# Patient Record
Sex: Female | Born: 1990 | Race: Black or African American | Hispanic: No | Marital: Single | Smoking: Never smoker
Health system: Southern US, Community
[De-identification: ages and names within clinical notes are randomized; demographics above are authoritative.]

## PROBLEM LIST (undated history)

## (undated) DIAGNOSIS — N809 Endometriosis, unspecified: Secondary | ICD-10-CM

## (undated) HISTORY — PX: LAPAROSCOPY: SHX197

---

## 2015-10-15 ENCOUNTER — Encounter (HOSPITAL_COMMUNITY): Payer: Self-pay | Admitting: *Deleted

## 2015-10-15 DIAGNOSIS — Z3A01 Less than 8 weeks gestation of pregnancy: Secondary | ICD-10-CM | POA: Diagnosis not present

## 2015-10-15 DIAGNOSIS — O0281 Inappropriate change in quantitative human chorionic gonadotropin (hCG) in early pregnancy: Secondary | ICD-10-CM | POA: Insufficient documentation

## 2015-10-15 DIAGNOSIS — O2301 Infections of kidney in pregnancy, first trimester: Secondary | ICD-10-CM | POA: Diagnosis present

## 2015-10-15 DIAGNOSIS — N200 Calculus of kidney: Secondary | ICD-10-CM | POA: Insufficient documentation

## 2015-10-15 LAB — COMPREHENSIVE METABOLIC PANEL
ALT: 12 U/L — ABNORMAL LOW (ref 14–54)
AST: 17 U/L (ref 15–41)
Albumin: 3.8 g/dL (ref 3.5–5.0)
Alkaline Phosphatase: 66 U/L (ref 38–126)
Anion gap: 6 (ref 5–15)
BUN: 9 mg/dL (ref 6–20)
CO2: 21 mmol/L — ABNORMAL LOW (ref 22–32)
Calcium: 8.9 mg/dL (ref 8.9–10.3)
Chloride: 106 mmol/L (ref 101–111)
Creatinine, Ser: 0.81 mg/dL (ref 0.44–1.00)
GFR calc Af Amer: >60 mL/min (ref >60)
GFR calc non Af Amer: >60 mL/min (ref >60)
Glucose, Bld: 80 mg/dL (ref 65–99)
Potassium: 3.5 mmol/L (ref 3.5–5.1)
Sodium: 133 mmol/L — ABNORMAL LOW (ref 135–145)
Total Bilirubin: 0.8 mg/dL (ref 0.3–1.2)
Total Protein: 6.9 g/dL (ref 6.5–8.1)

## 2015-10-15 LAB — URINE MICROSCOPIC-ADD ON

## 2015-10-15 LAB — URINALYSIS, ROUTINE W REFLEX MICROSCOPIC
BILIRUBIN URINE: NEGATIVE
Glucose, UA: NEGATIVE mg/dL
KETONES UR: 15 mg/dL — AB
NITRITE: POSITIVE — AB
PH: 7 (ref 5.0–8.0)
PROTEIN: 30 mg/dL — AB
Specific Gravity, Urine: 1.009 (ref 1.005–1.030)

## 2015-10-15 LAB — CBC
HCT: 40.6 % (ref 36.0–46.0)
Hemoglobin: 13.5 g/dL (ref 12.0–15.0)
MCH: 32.1 pg (ref 26.0–34.0)
MCHC: 33.3 g/dL (ref 30.0–36.0)
MCV: 96.4 fL (ref 78.0–100.0)
Platelets: 250 10*3/uL (ref 150–400)
RBC: 4.21 MIL/uL (ref 3.87–5.11)
RDW: 12.6 % (ref 11.5–15.5)
WBC: 13.7 10*3/uL — ABNORMAL HIGH (ref 4.0–10.5)

## 2015-10-15 LAB — I-STAT BETA HCG BLOOD, ED (MC, WL, AP ONLY)

## 2015-10-15 NOTE — ED Triage Notes (Signed)
Pt c/o R lower back pain with dysuria onset this morning. Had an episode of diarrhea and NV. Reports not having a period x 2 months.

## 2015-10-16 ENCOUNTER — Emergency Department (HOSPITAL_COMMUNITY)
Admission: EM | Admit: 2015-10-16 | Discharge: 2015-10-16 | Disposition: A | Discharge status: Home / Self Care | Payer: BLUE CROSS/BLUE SHIELD | Attending: Emergency Medicine | Admitting: Emergency Medicine

## 2015-10-16 DIAGNOSIS — N12 Tubulo-interstitial nephritis, not specified as acute or chronic: Secondary | ICD-10-CM

## 2015-10-16 DIAGNOSIS — Z3490 Encounter for supervision of normal pregnancy, unspecified, unspecified trimester: Secondary | ICD-10-CM

## 2015-10-16 HISTORY — DX: Endometriosis, unspecified: N80.9

## 2015-10-16 MED ORDER — SULFAMETHOXAZOLE-TRIMETHOPRIM 800-160 MG PO TABS: 1.0000 | ORAL_TABLET | Freq: Once | ORAL | Status: DC

## 2015-10-16 MED ORDER — CEFTRIAXONE SODIUM 2 G IJ SOLR
2.0000 g | Freq: Once | INTRAMUSCULAR | Status: AC
  Administered 2015-10-16: 2 g via INTRAVENOUS
  Filled 2015-10-16: qty 2

## 2015-10-16 MED ORDER — SULFAMETHOXAZOLE-TRIMETHOPRIM 800-160 MG PO TABS: 1.0000 | ORAL_TABLET | Freq: Two times a day (BID) | ORAL | 0 refills | Status: AC

## 2015-10-16 NOTE — ED Provider Notes (Signed)
[facility]-EMERGENCY DEPT Provider Note   CSN: [id number] Arrival date & time: 10/15/15  1943     History   Chief Complaint Chief Complaint  Patient presents with  . Abdominal Pain    HPI Mindy Russo is a 25 y.o. female with no significant past medical history presenting today for back pain. Patient states this is been going on for the past 24 hours. She's also had dysuria and increased urgency. Patient states this is consistent with her prior urinary tract infections. Her pain is in her right flank. She denies any subjective fevers. She has had nausea and vomiting as well.  She has not taken anything for his symptoms. Patient is also concentric may be pregnant as she missed her menstrual cycle this month. There are no further complaints.  10 Systems reviewed and are negative for acute change except as noted in the HPI.    HPI  Past Medical History:  Diagnosis Date  . Endometriosis     There are no active problems to display for this patient.   History reviewed. No pertinent surgical history.  OB History    No data available       Home Medications    Prior to Admission medications   Medication Sig Start Date End Date Taking? Authorizing Provider  sulfamethoxazole-trimethoprim (BACTRIM DS,SEPTRA DS) 800-160 MG tablet Take 1 tablet by mouth 2 (two) times daily. 10/16/15 10/23/15  Tomasita Crumble, MD  sulfamethoxazole-trimethoprim (BACTRIM DS,SEPTRA DS) 800-160 MG tablet Take 1 tablet by mouth 2 (two) times daily. 10/16/15 10/19/15  Tomasita Crumble, MD    Family History No family history on file.  Social History Social History  Substance Use Topics  . Smoking status: Never Smoker  . Smokeless tobacco: Not on file  . Alcohol use No     Allergies   Amoxicillin   Review of Systems Review of Systems   Physical Exam Updated Vital Signs BP 129/68   Pulse 72   Temp 97.9 F (36.6 C)   Resp 18   Ht 5\' 8"  (1.727 m)   Wt 155 lb 5 oz (70.4 kg)   LMP 08/17/2015   SpO2  100%   BMI 23.62 kg/m   Physical Exam  Constitutional: She is oriented to person, place, and time. She appears well-developed and well-nourished. No distress.  HENT:  Head: Normocephalic and atraumatic.  Nose: Nose normal.  Mouth/Throat: Oropharynx is clear and moist. No oropharyngeal exudate.  Eyes: Conjunctivae and EOM are normal. Pupils are equal, round, and reactive to light. No scleral icterus.  Neck: Normal range of motion. Neck supple. No JVD present. No tracheal deviation present. No thyromegaly present.  Cardiovascular: Normal rate, regular rhythm and normal heart sounds.  Exam reveals no gallop and no friction rub.   No murmur heard. Pulmonary/Chest: Effort normal and breath sounds normal. No respiratory distress. She has no wheezes. She exhibits no tenderness.  Abdominal: Soft. Bowel sounds are normal. She exhibits no distension and no mass. There is no tenderness. There is no rebound and no guarding.  Musculoskeletal: Normal range of motion. She exhibits no edema or tenderness.  Lymphadenopathy:    She has no cervical adenopathy.  Neurological: She is alert and oriented to person, place, and time. No cranial nerve deficit. She exhibits normal muscle tone.  Skin: Skin is warm and dry. No rash noted. No erythema. No pallor.  Nursing note and vitals reviewed.    ED Treatments / Results  Labs (all labs ordered are listed, but only abnormal  results are displayed) Labs Reviewed  COMPREHENSIVE METABOLIC PANEL - Abnormal; Notable for the following:       Result Value   Sodium 133 (*)    CO2 21 (*)    ALT 12 (*)    All other components within normal limits  CBC - Abnormal; Notable for the following:    WBC 13.7 (*)    All other components within normal limits  URINALYSIS, ROUTINE W REFLEX MICROSCOPIC (NOT [facility]) - Abnormal; Notable for the following:    APPearance CLOUDY (*)    Hgb urine dipstick MODERATE (*)    Ketones, ur 15 (*)    Protein, ur 30 (*)    Nitrite  POSITIVE (*)    Leukocytes, UA MODERATE (*)    All other components within normal limits  URINE MICROSCOPIC-ADD ON - Abnormal; Notable for the following:    Squamous Epithelial / LPF 6-30 (*)    Bacteria, UA MANY (*)    All other components within normal limits  I-STAT BETA HCG BLOOD, ED ([facility], [facility], [facility] ONLY) - Abnormal; Notable for the following:    I-stat hCG, quantitative >2,000.0 (*)    All other components within normal limits  POC URINE PREG, ED    EKG  EKG Interpretation None       Radiology No results found.  Procedures Procedures (including critical care time)  Medications Ordered in ED Medications  cefTRIAXone (ROCEPHIN) 2 g in dextrose 5 % 50 mL IVPB (not administered)     Initial Impression / Assessment and Plan / ED Course  I have reviewed the triage vital signs and the nursing notes.  Pertinent labs & imaging results that were available during my care of the patient were reviewed by me and considered in my medical decision making (see chart for details).  Clinical Course   Patient presents to the ED for back pain and urinary symptoms.  UA reveals an infection.  I have concern for pyeloneprhitis in the setting of pregnancy.  Patient is likely safe for DC home with keflex however she has an amoxicillin allergy.  [facility] to give the patient ceftriaxone in the ED the DC with bactrim.  Uptodate reveals that bactrim is category D in pregnancy, however Dr. Jillyn LedgerBeur states it is safe and he uses it all the time for treatment.  Will send home with 3 days and OB follow up.  Patient is amendable to the plan.  She appears well andin NAD. Vs remain within her normal limit and she is safe for DC.  Final Clinical Impressions(s) / ED Diagnoses   Final diagnoses:  Pyelonephritis  Pregnancy    New Prescriptions New Prescriptions   SULFAMETHOXAZOLE-TRIMETHOPRIM (BACTRIM DS,SEPTRA DS) 800-160 MG TABLET    Take 1 tablet by mouth 2 (two)  times daily.   SULFAMETHOXAZOLE-TRIMETHOPRIM (BACTRIM DS,SEPTRA DS) 800-160 MG TABLET    Take 1 tablet by mouth 2 (two) times daily.     Tomasita CrumbleAdeleke Casimira Sutphin, MD 10/16/15 [phone]

## 2015-12-22 ENCOUNTER — Ambulatory Visit (INDEPENDENT_AMBULATORY_CARE_PROVIDER_SITE_OTHER): Payer: BLUE CROSS/BLUE SHIELD | Admitting: Family Medicine

## 2015-12-22 ENCOUNTER — Encounter: Payer: Self-pay | Admitting: Family Medicine

## 2015-12-22 VITALS — BP 126/62 | HR 79 | Wt 162.5 lb

## 2015-12-22 DIAGNOSIS — O9989 Other specified diseases and conditions complicating pregnancy, childbirth and the puerperium: Secondary | ICD-10-CM

## 2015-12-22 DIAGNOSIS — Z3490 Encounter for supervision of normal pregnancy, unspecified, unspecified trimester: Secondary | ICD-10-CM | POA: Insufficient documentation

## 2015-12-22 DIAGNOSIS — O09899 Supervision of other high risk pregnancies, unspecified trimester: Secondary | ICD-10-CM

## 2015-12-22 DIAGNOSIS — O99820 Streptococcus B carrier state complicating pregnancy: Secondary | ICD-10-CM | POA: Insufficient documentation

## 2015-12-22 DIAGNOSIS — Z113 Encounter for screening for infections with a predominantly sexual mode of transmission: Secondary | ICD-10-CM | POA: Diagnosis not present

## 2015-12-22 DIAGNOSIS — Z283 Underimmunization status: Secondary | ICD-10-CM

## 2015-12-22 DIAGNOSIS — Z3482 Encounter for supervision of other normal pregnancy, second trimester: Secondary | ICD-10-CM

## 2015-12-22 MED ORDER — PRENATAL VITAMINS 0.8 MG PO TABS: 1.0000 | ORAL_TABLET | Freq: Every day | ORAL | 12 refills | Status: DC

## 2015-12-22 NOTE — Progress Notes (Signed)
Declined baby scripts

## 2015-12-22 NOTE — Progress Notes (Signed)
   Subjective:    Mindy Russo is a G2P1001 4535w0d being seen today for her first obstetrical visit.  Her obstetrical history is significant for short interval between pregnancies.Pregnancy history fully reviewed.  Patient reports no complaints.  Vitals:   12/22/15 1303  BP: 126/62  Pulse: 79  Weight: 162 lb 8 oz (73.7 kg)    HISTORY: OB History  Gravida Para Term Preterm AB Living  2 1 1     1   SAB TAB Ectopic Multiple Live Births          1    # Outcome Date GA Lbr Len/2nd Weight Sex Delivery Anes PTL Lv  2 Current           1 Term 12/17/14 2250w0d  6 lb 15 oz (3.147 kg) F Vag-Spont  N LIV     Past Medical History:  Diagnosis Date  . Endometriosis    Past Surgical History:  Procedure Laterality Date  . LAPAROSCOPY     Family History  Problem Relation Age of Onset  . Diabetes Mother      Exam    Uterus:  Fundal Height: 18 cm  Pelvic Exam:    Skin: normal coloration and turgor, no rashes    Neurologic: normal   Extremities: normal strength, tone, and muscle mass   HEENT extra ocular movement intact and sclera clear, anicteric   Mouth/Teeth mucous membranes moist, pharynx normal without lesions   Neck supple   Cardiovascular: regular rate and rhythm, no murmurs or gallops   Respiratory:  appears well, vitals normal, no respiratory distress, acyanotic, normal RR, ear and throat exam is normal, neck free of mass or lymphadenopathy, chest clear, no wheezing, crepitations, rhonchi, normal symmetric air entry   Abdomen: soft, non-tender; bowel sounds normal; no masses,  no organomegaly      Assessment/Plan:  1. Encounter for supervision of other normal pregnancy in second trimester - Prenatal Profile - Hemoglobinopathy Evaluation - GC/Chlamydia probe amp ([place])not at [facility] - Pain Mgmt, Profile 6 Conf w/o mM, U - US MFM OB COMP + 14 WK; Future - Culture, OB Urine - Prenatal Multivit-Min-Fe-FA (PRENATAL VITAMINS) 0.8 MG tablet; Take 1 tablet by mouth  daily.  Dispense: 30 tablet; Refill: 12 Genetic Screening discussed Quad Screen: declined. Per patient had normal pap last year  Follow up in 4 weeks.  Reva Boresanya S Pratt 12/22/2015

## 2015-12-22 NOTE — Patient Instructions (Signed)
Second Trimester of Pregnancy The second trimester is from week 13 through week 28 (months 4 through 6). The second trimester is often a time when you feel your best. Your body has also adjusted to being pregnant, and you begin to feel better physically. Usually, morning sickness has lessened or quit completely, you may have more energy, and you may have an increase in appetite. The second trimester is also a time when the fetus is growing rapidly. At the end of the sixth month, the fetus is about 9 inches long and weighs about 1 pounds. You will likely begin to feel the baby move (quickening) between 18 and 20 weeks of the pregnancy. Body changes during your second trimester Your body continues to go through many changes during your second trimester. The changes vary from woman to woman.  Your weight will continue to increase. You will notice your lower abdomen bulging out.  You may begin to get stretch marks on your hips, abdomen, and breasts.  You may develop headaches that can be relieved by medicines. The medicines should be approved by your health care provider.  You may urinate more often because the fetus is pressing on your bladder.  You may develop or continue to have heartburn as a result of your pregnancy.  You may develop constipation because certain hormones are causing the muscles that push waste through your intestines to slow down.  You may develop hemorrhoids or swollen, bulging veins (varicose veins).  You may have back pain. This is caused by:  Weight gain.  Pregnancy hormones that are relaxing the joints in your pelvis.  A shift in weight and the muscles that support your balance.  Your breasts will continue to grow and they will continue to become tender.  Your gums may bleed and may be sensitive to brushing and flossing.  Dark spots or blotches (chloasma, mask of pregnancy) may develop on your face. This will likely fade after the baby is born.  A dark line  from your belly button to the pubic area (linea nigra) may appear. This will likely fade after the baby is born.  You may have changes in your hair. These can include thickening of your hair, rapid growth, and changes in texture. Some women also have hair loss during or after pregnancy, or hair that feels dry or thin. Your hair will most likely return to normal after your baby is born. What to expect at prenatal visits During a routine prenatal visit:  You will be weighed to make sure you and the fetus are growing normally.  Your blood pressure will be taken.  Your abdomen will be measured to track your baby's growth.  The fetal heartbeat will be listened to.  Any test results from the previous visit will be discussed. Your health care provider may ask you:  How you are feeling.  If you are feeling the baby move.  If you have had any abnormal symptoms, such as leaking fluid, bleeding, severe headaches, or abdominal cramping.  If you are using any tobacco products, including cigarettes, chewing tobacco, and electronic cigarettes.  If you have any questions. Other tests that may be performed during your second trimester include:  Blood tests that check for:  Low iron levels (anemia).  Gestational diabetes (between 24 and 28 weeks).  Rh antibodies. This is to check for a protein on red blood cells (Rh factor).  Urine tests to check for infections, diabetes, or protein in the urine.  An ultrasound to   confirm the proper growth and development of the baby.  An amniocentesis to check for possible genetic problems.  Fetal screens for spina bifida and Down syndrome.  HIV (human immunodeficiency virus) testing. Routine prenatal testing includes screening for HIV, unless you choose not to have this test. Follow these instructions at home: Eating and drinking  Continue to eat regular, healthy meals.  Avoid raw meat, uncooked cheese, cat litter boxes, and soil used by cats. These  carry germs that can cause birth defects in the baby.  Take your prenatal vitamins.  Take 1500-2000 mg of calcium daily starting at the 20th week of pregnancy until you deliver your baby.  If you develop constipation:  Take over-the-counter or prescription medicines.  Drink enough fluid to keep your urine clear or pale yellow.  Eat foods that are high in fiber, such as fresh fruits and vegetables, whole grains, and beans.  Limit foods that are high in fat and processed sugars, such as fried and sweet foods. Activity  Exercise only as directed by your health care provider. Experiencing uterine cramps is a good sign to stop exercising.  Avoid heavy lifting, wear low heel shoes, and practice good posture.  Wear your seat belt at all times when driving.  Rest with your legs elevated if you have leg cramps or low back pain.  Wear a good support bra for breast tenderness.  Do not use hot tubs, steam rooms, or saunas. Lifestyle  Avoid all smoking, herbs, alcohol, and unprescribed drugs. These chemicals affect the formation and growth of the baby.  Do not use any products that contain nicotine or tobacco, such as cigarettes and e-cigarettes. If you need help quitting, ask your health care provider.  A sexual relationship may be continued unless your health care provider directs you otherwise. General instructions  Follow your health care provider's instructions regarding medicine use. There are medicines that are either safe or unsafe to take during pregnancy.  Take warm sitz baths to soothe any pain or discomfort caused by hemorrhoids. Use hemorrhoid cream if your health care provider approves.  If you develop varicose veins, wear support hose. Elevate your feet for 15 minutes, 3-4 times a day. Limit salt in your diet.  Visit your dentist if you have not gone yet during your pregnancy. Use a soft toothbrush to brush your teeth and be gentle when you floss.  Keep all follow-up  prenatal visits as told by your health care provider. This is important. Contact a health care provider if:  You have dizziness.  You have mild pelvic cramps, pelvic pressure, or nagging pain in the abdominal area.  You have persistent nausea, vomiting, or diarrhea.  You have a bad smelling vaginal discharge.  You have pain with urination. Get help right away if:  You have a fever.  You are leaking fluid from your vagina.  You have spotting or bleeding from your vagina.  You have severe abdominal cramping or pain.  You have rapid weight gain or weight loss.  You have shortness of breath with chest pain.  You notice sudden or extreme swelling of your face, hands, ankles, feet, or legs.  You have not felt your baby move in over an hour.  You have severe headaches that do not go away with medicine.  You have vision changes. Summary  The second trimester is from week 13 through week 28 (months 4 through 6). It is also a time when the fetus is growing rapidly.  Your body goes   through many changes during pregnancy. The changes vary from woman to woman.  Avoid all smoking, herbs, alcohol, and unprescribed drugs. These chemicals affect the formation and growth your baby.  Do not use any tobacco products, such as cigarettes, chewing tobacco, and e-cigarettes. If you need help quitting, ask your health care provider.  Contact your health care provider if you have any questions. Keep all prenatal visits as told by your health care provider. This is important. This information is not intended to replace advice given to you by your health care provider. Make sure you discuss any questions you have with your health care provider. Document Released: 01/05/2001 Document Revised: 06/19/2015 Document Reviewed: 03/14/2012 Elsevier Interactive Patient Education  2017 [employer]   Breastfeeding Deciding to breastfeed is one of the best choices you can make for you and your baby. A  change in hormones during pregnancy causes your breast tissue to grow and increases the number and size of your milk ducts. These hormones also allow proteins, sugars, and fats from your blood supply to make breast milk in your milk-producing glands. Hormones prevent breast milk from being released before your baby is born as well as prompt milk flow after birth. Once breastfeeding has begun, thoughts of your baby, as well as his or her sucking or crying, can stimulate the release of milk from your milk-producing glands. Benefits of breastfeeding For Your Baby  Your first milk (colostrum) helps your baby's digestive system function better.  There are antibodies in your milk that help your baby fight off infections.  Your baby has a lower incidence of asthma, allergies, and sudden infant death syndrome.  The nutrients in breast milk are better for your baby than infant formulas and are designed uniquely for your baby's needs.  Breast milk improves your baby's brain development.  Your baby is less likely to develop other conditions, such as childhood obesity, asthma, or type 2 diabetes mellitus. For You  Breastfeeding helps to create a very special bond between you and your baby.  Breastfeeding is convenient. Breast milk is always available at the correct temperature and costs nothing.  Breastfeeding helps to burn calories and helps you lose the weight gained during pregnancy.  Breastfeeding makes your uterus contract to its prepregnancy size faster and slows bleeding (lochia) after you give birth.  Breastfeeding helps to lower your risk of developing type 2 diabetes mellitus, osteoporosis, and breast or ovarian cancer later in life. Signs that your baby is hungry Early Signs of Hunger  Increased alertness or activity.  Stretching.  Movement of the head from side to side.  Movement of the head and opening of the mouth when the corner of the mouth or cheek is stroked  (rooting).  Increased sucking sounds, smacking lips, cooing, sighing, or squeaking.  Hand-to-mouth movements.  Increased sucking of fingers or hands. Late Signs of Hunger  Fussing.  Intermittent crying. Extreme Signs of Hunger  Signs of extreme hunger will require calming and consoling before your baby will be able to breastfeed successfully. Do not wait for the following signs of extreme hunger to occur before you initiate breastfeeding:  Restlessness.  A loud, strong cry.  Screaming. Breastfeeding basics  Breastfeeding Initiation  Find a comfortable place to sit or lie down, with your neck and back well supported.  Place a pillow or rolled up blanket under your baby to bring him or her to the level of your breast (if you are seated). Nursing pillows are specially designed to help   support your arms and your baby while you breastfeed.  Make sure that your baby's abdomen is facing your abdomen.  Gently massage your breast. With your fingertips, massage from your chest wall toward your nipple in a circular motion. This encourages milk flow. You may need to continue this action during the feeding if your milk flows slowly.  Support your breast with 4 fingers underneath and your thumb above your nipple. Make sure your fingers are well away from your nipple and your baby's mouth.  Stroke your baby's lips gently with your finger or nipple.  When your baby's mouth is open wide enough, quickly bring your baby to your breast, placing your entire nipple and as much of the colored area around your nipple (areola) as possible into your baby's mouth.  More areola should be visible above your baby's upper lip than below the lower lip.  Your baby's tongue should be between his or her lower gum and your breast.  Ensure that your baby's mouth is correctly positioned around your nipple (latched). Your baby's lips should create a seal on your breast and be turned out (everted).  It is common  for your baby to suck about 2-3 minutes in order to start the flow of breast milk. Latching  Teaching your baby how to latch on to your breast properly is very important. An improper latch can cause nipple pain and decreased milk supply for you and poor weight gain in your baby. Also, if your baby is not latched onto your nipple properly, he or she may swallow some air during feeding. This can make your baby fussy. Burping your baby when you switch breasts during the feeding can help to get rid of the air. However, teaching your baby to latch on properly is still the best way to prevent fussiness from swallowing air while breastfeeding. Signs that your baby has successfully latched on to your nipple:  Silent tugging or silent sucking, without causing you pain.  Swallowing heard between every 3-4 sucks.  Muscle movement above and in front of his or her ears while sucking. Signs that your baby has not successfully latched on to nipple:  Sucking sounds or smacking sounds from your baby while breastfeeding.  Nipple pain. If you think your baby has not latched on correctly, slip your finger into the corner of your baby's mouth to break the suction and place it between your baby's gums. Attempt breastfeeding initiation again. Signs of Successful Breastfeeding  Signs from your baby:  A gradual decrease in the number of sucks or complete cessation of sucking.  Falling asleep.  Relaxation of his or her body.  Retention of a small amount of milk in his or her mouth.  Letting go of your breast by himself or herself. Signs from you:  Breasts that have increased in firmness, weight, and size 1-3 hours after feeding.  Breasts that are softer immediately after breastfeeding.  Increased milk volume, as well as a change in milk consistency and color by the fifth day of breastfeeding.  Nipples that are not sore, cracked, or bleeding. Signs That Your Baby is Getting Enough Milk  Wetting at least  1-2 diapers during the first 24 hours after birth.  Wetting at least 5-6 diapers every 24 hours for the first week after birth. The urine should be clear or pale yellow by 5 days after birth.  Wetting 6-8 diapers every 24 hours as your baby continues to grow and develop.  At least 3 stools in   a 24-hour period by age 5 days. The stool should be soft and yellow.  At least 3 stools in a 24-hour period by age 7 days. The stool should be seedy and yellow.  No loss of weight greater than 10% of birth weight during the first 3 days of age.  Average weight gain of 4-7 ounces (113-198 g) per week after age 4 days.  Consistent daily weight gain by age 5 days, without weight loss after the age of 2 weeks. After a feeding, your baby may spit up a small amount. This is common. Breastfeeding frequency and duration Frequent feeding will help you make more milk and can prevent sore nipples and breast engorgement. Breastfeed when you feel the need to reduce the fullness of your breasts or when your baby shows signs of hunger. This is called "breastfeeding on demand." Avoid introducing a pacifier to your baby while you are working to establish breastfeeding (the first 4-6 weeks after your baby is born). After this time you may choose to use a pacifier. Research has shown that pacifier use during the first year of a baby's life decreases the risk of sudden infant death syndrome (SIDS). Allow your baby to feed on each breast as long as he or she wants. Breastfeed until your baby is finished feeding. When your baby unlatches or falls asleep while feeding from the first breast, offer the second breast. Because newborns are often sleepy in the first few weeks of life, you may need to awaken your baby to get him or her to feed. Breastfeeding times will vary from baby to baby. However, the following rules can serve as a guide to help you ensure that your baby is properly fed:  Newborns (babies 4 weeks of age or younger)  may breastfeed every 1-3 hours.  Newborns should not go longer than 3 hours during the day or 5 hours during the night without breastfeeding.  You should breastfeed your baby a minimum of 8 times in a 24-hour period until you begin to introduce solid foods to your baby at around 6 months of age. Breast milk pumping Pumping and storing breast milk allows you to ensure that your baby is exclusively fed your breast milk, even at times when you are unable to breastfeed. This is especially important if you are going back to work while you are still breastfeeding or when you are not able to be present during feedings. Your lactation consultant can give you guidelines on how long it is safe to store breast milk. A breast pump is a machine that allows you to pump milk from your breast into a sterile bottle. The pumped breast milk can then be stored in a refrigerator or freezer. Some breast pumps are operated by hand, while others use electricity. Ask your lactation consultant which type will work best for you. Breast pumps can be purchased, but some hospitals and breastfeeding support groups lease breast pumps on a monthly basis. A lactation consultant can teach you how to hand express breast milk, if you prefer not to use a pump. Caring for your breasts while you breastfeed Nipples can become dry, cracked, and sore while breastfeeding. The following recommendations can help keep your breasts moisturized and healthy:  Avoid using soap on your nipples.  Wear a supportive bra. Although not required, special nursing bras and tank tops are designed to allow access to your breasts for breastfeeding without taking off your entire bra or top. Avoid wearing underwire-style bras or extremely tight   bras.  Air dry your nipples for 3-4minutes after each feeding.  Use only cotton bra pads to absorb leaked breast milk. Leaking of breast milk between feedings is normal.  Use lanolin on your nipples after breastfeeding.  Lanolin helps to maintain your skin's normal moisture barrier. If you use pure lanolin, you do not need to wash it off before feeding your baby again. Pure lanolin is not toxic to your baby. You may also hand express a few drops of breast milk and gently massage that milk into your nipples and allow the milk to air dry. In the first few weeks after giving birth, some women experience extremely full breasts (engorgement). Engorgement can make your breasts feel heavy, warm, and tender to the touch. Engorgement peaks within 3-5 days after you give birth. The following recommendations can help ease engorgement:  Completely empty your breasts while breastfeeding or pumping. You may want to start by applying warm, moist heat (in the shower or with warm water-soaked hand towels) just before feeding or pumping. This increases circulation and helps the milk flow. If your baby does not completely empty your breasts while breastfeeding, pump any extra milk after he or she is finished.  Wear a snug bra (nursing or regular) or tank top for 1-2 days to signal your body to slightly decrease milk production.  Apply ice packs to your breasts, unless this is too uncomfortable for you.  Make sure that your baby is latched on and positioned properly while breastfeeding. If engorgement persists after 48 hours of following these recommendations, contact your health care provider or a lactation consultant. Overall health care recommendations while breastfeeding  Eat healthy foods. Alternate between meals and snacks, eating 3 of each per day. Because what you eat affects your breast milk, some of the foods may make your baby more irritable than usual. Avoid eating these foods if you are sure that they are negatively affecting your baby.  Drink milk, fruit juice, and water to satisfy your thirst (about 10 glasses a day).  Rest often, relax, and continue to take your prenatal vitamins to prevent fatigue, stress, and  anemia.  Continue breast self-awareness checks.  Avoid chewing and smoking tobacco. Chemicals from cigarettes that pass into breast milk and exposure to secondhand smoke may harm your baby.  Avoid alcohol and drug use, including marijuana. Some medicines that may be harmful to your baby can pass through breast milk. It is important to ask your health care provider before taking any medicine, including all over-the-counter and prescription medicine as well as vitamin and herbal supplements. It is possible to become pregnant while breastfeeding. If birth control is desired, ask your health care provider about options that will be safe for your baby. Contact a health care provider if:  You feel like you want to stop breastfeeding or have become frustrated with breastfeeding.  You have painful breasts or nipples.  Your nipples are cracked or bleeding.  Your breasts are red, tender, or warm.  You have a swollen area on either breast.  You have a fever or chills.  You have nausea or vomiting.  You have drainage other than breast milk from your nipples.  Your breasts do not become full before feedings by the fifth day after you give birth.  You feel sad and depressed.  Your baby is too sleepy to eat well.  Your baby is having trouble sleeping.  Your baby is wetting less than 3 diapers in a 24-hour period.  Your baby   has less than 3 stools in a 24-hour period.  Your baby's skin or the white part of his or her eyes becomes yellow.  Your baby is not gaining weight by 5 days of age. Get help right away if:  Your baby is overly tired (lethargic) and does not want to wake up and feed.  Your baby develops an unexplained fever. This information is not intended to replace advice given to you by your health care provider. Make sure you discuss any questions you have with your health care provider. Document Released: 01/11/2005 Document Revised: 06/25/2015 Document Reviewed:  07/05/2012 Elsevier Interactive Patient Education  2017 [employer]  

## 2015-12-22 NOTE — Progress Notes (Signed)
DECLINE FLU AT THIS TIME

## 2015-12-23 LAB — PRENATAL PROFILE (SOLSTAS)
ANTIBODY SCREEN: NEGATIVE
BASOS ABS: 0 {cells}/uL (ref 0–200)
Basophils Relative: 0 %
EOS PCT: 1 %
Eosinophils Absolute: 99 cells/uL (ref 15–500)
HEMATOCRIT: 35.6 % (ref 35.0–45.0)
HEMOGLOBIN: 11.8 g/dL (ref 11.7–15.5)
HEP B S AG: NEGATIVE
HIV 1&2 Ab, 4th Generation: NONREACTIVE
LYMPHS ABS: 1089 {cells}/uL (ref 850–3900)
Lymphocytes Relative: 11 %
MCH: 32.5 pg (ref 27.0–33.0)
MCHC: 33.1 g/dL (ref 32.0–36.0)
MCV: 98.1 fL (ref 80.0–100.0)
MONOS PCT: 4 %
MPV: 11.1 fL (ref 7.5–12.5)
Monocytes Absolute: 396 cells/uL (ref 200–950)
NEUTROS ABS: 8316 {cells}/uL — AB (ref 1500–7800)
NEUTROS PCT: 84 %
Platelets: 215 10*3/uL (ref 140–400)
RBC: 3.63 MIL/uL — AB (ref 3.80–5.10)
RDW: 12.4 % (ref 11.0–15.0)
RUBELLA: 0.98 {index} — AB (ref <0.90)
Rh Type: POSITIVE
WBC: 9.9 10*3/uL (ref 3.8–10.8)

## 2015-12-23 LAB — PAIN MGMT, PROFILE 6 CONF W/O MM, U
6 Acetylmorphine: NEGATIVE ng/mL (ref <10)
ALCOHOL METABOLITES: NEGATIVE ng/mL (ref <500)
AMPHETAMINES: NEGATIVE ng/mL (ref <500)
Barbiturates: NEGATIVE ng/mL (ref <300)
Benzodiazepines: NEGATIVE ng/mL (ref <100)
CREATININE: 186.7 mg/dL (ref >=20.0)
Cocaine Metabolite: NEGATIVE ng/mL (ref <150)
METHADONE METABOLITE: NEGATIVE ng/mL (ref <100)
Marijuana Metabolite: NEGATIVE ng/mL (ref <20)
OPIATES: NEGATIVE ng/mL (ref <100)
OXIDANT: NEGATIVE ug/mL (ref <200)
Oxycodone: NEGATIVE ng/mL (ref <100)
PH: 6.27 (ref 4.5–9.0)
Phencyclidine: NEGATIVE ng/mL (ref <25)
Please note:: 0

## 2015-12-23 LAB — CULTURE, OB URINE

## 2015-12-24 ENCOUNTER — Encounter (HOSPITAL_COMMUNITY): Payer: Self-pay | Admitting: Family Medicine

## 2015-12-24 DIAGNOSIS — O9989 Other specified diseases and conditions complicating pregnancy, childbirth and the puerperium: Secondary | ICD-10-CM

## 2015-12-24 DIAGNOSIS — Z283 Underimmunization status: Secondary | ICD-10-CM | POA: Insufficient documentation

## 2015-12-24 DIAGNOSIS — Z2839 Other underimmunization status: Secondary | ICD-10-CM | POA: Insufficient documentation

## 2015-12-29 LAB — HEMOGLOBINOPATHY EVALUATION
HEMATOCRIT: 35.6 % (ref 35.0–45.0)
HGB A2 QUANT: 2.6 % (ref 1.8–3.5)
HGB A: 96.4 % (ref >96.0)
Hemoglobin: 11.8 g/dL (ref 11.7–15.5)
MCH: 32.5 pg (ref 27.0–33.0)
MCV: 98.1 fL (ref 80.0–100.0)
RBC: 3.63 MIL/uL — AB (ref 3.80–5.10)
RDW: 12.4 % (ref 11.0–15.0)

## 2016-01-01 ENCOUNTER — Ambulatory Visit (HOSPITAL_COMMUNITY)
Admission: RE | Admit: 2016-01-01 | Discharge: 2016-01-01 | Disposition: A | Discharge status: Home / Self Care | Payer: BLUE CROSS/BLUE SHIELD | Source: Ambulatory Visit | Attending: Family Medicine | Admitting: Family Medicine

## 2016-01-01 DIAGNOSIS — Z3A20 20 weeks gestation of pregnancy: Secondary | ICD-10-CM | POA: Diagnosis not present

## 2016-01-01 DIAGNOSIS — Z3687 Encounter for antenatal screening for uncertain dates: Secondary | ICD-10-CM | POA: Insufficient documentation

## 2016-01-01 DIAGNOSIS — O0932 Supervision of pregnancy with insufficient antenatal care, second trimester: Secondary | ICD-10-CM | POA: Diagnosis not present

## 2016-01-01 DIAGNOSIS — Z3482 Encounter for supervision of other normal pregnancy, second trimester: Secondary | ICD-10-CM

## 2016-01-01 NOTE — Addendum Note (Signed)
Encounter addended by: Vanetta Shawlarolyn H Angeligue Bowne, RT, RVT, RDMS on: 01/01/2016 12:25 PM<BR>    Actions taken: Imaging Exam ended

## 2016-01-20 ENCOUNTER — Encounter: Payer: BLUE CROSS/BLUE SHIELD | Admitting: Family Medicine

## 2016-01-26 NOTE — L&D Delivery Note (Signed)
26 y.o. G2P1001 at [redacted]w[redacted]d admitted for IOL for mild range BP's and delivered a viable female infant in cephalic, ROA position. No nuchal cord. Cord clamped x2 and cut after 60 minute delay. Placenta delivered spontaneously intact, with 3VC. Fundus firm on exam with massage and pitocin. Good hemostasis noted.  Anesthesia: Epidural Laceration: none Suture: n/a Good hemostasis noted. EBL: 100 cc  Mom and baby recovering in LDR.    Apgars: APGAR (1 MIN): 8 APGAR (5 MINS):  9  Weight: Pending skin to skin  Candelaria Stagers, MD.  PGY-2, [facility] Pager [phone] 05/04/16  4:32 AM   Midwife attestation: I was gloved and present for delivery in its entirety and I agree with the above resident's note.  Donette Larry, CNM 7:15 AM

## 2016-02-02 ENCOUNTER — Encounter: Payer: Self-pay | Admitting: Medical

## 2016-02-02 ENCOUNTER — Ambulatory Visit (INDEPENDENT_AMBULATORY_CARE_PROVIDER_SITE_OTHER): Payer: BLUE CROSS/BLUE SHIELD | Admitting: Medical

## 2016-02-02 DIAGNOSIS — Z3482 Encounter for supervision of other normal pregnancy, second trimester: Secondary | ICD-10-CM

## 2016-02-02 NOTE — Progress Notes (Signed)
   PRENATAL VISIT NOTE  Subjective:  Mindy Russo is a 26 y.o. G2P1001 at 3484w0d being seen today for ongoing prenatal care.  She is currently monitored for the following issues for this low-risk pregnancy and has Supervision of normal pregnancy; Short interval between pregnancies affecting pregnancy, antepartum; and Rubella non-immune status, antepartum on her problem list.  Patient reports no complaints.  Contractions: Not present. Vag. Bleeding: None.  Movement: Present. Denies leaking of fluid.   The following portions of the patient's history were reviewed and updated as appropriate: allergies, current medications, past family history, past medical history, past social history, past surgical history and problem list. Problem list updated.  Objective:   Vitals:   02/02/16 1308  BP: 136/69  Pulse: (!) 105  Weight: 163 lb 14.4 oz (74.3 kg)    Fetal Status: Fetal Heart Rate (bpm): 150   Movement: Present     General:  Alert, oriented and cooperative. Patient is in no acute distress.  Skin: Skin is warm and dry. No rash noted.   Cardiovascular: Normal heart rate noted  Respiratory: Normal respiratory effort, no problems with respiration noted  Abdomen: Soft, gravid, appropriate for gestational age. Pain/Pressure: Absent     Pelvic:  Cervical exam deferred        Extremities: Normal range of motion.  Edema: None  Mental Status: Normal mood and affect. Normal behavior. Normal judgment and thought content.   Assessment and Plan:  Pregnancy: G2P1001 at 8084w0d  1. Encounter for supervision of other normal pregnancy in second trimester - Patient reports mild cold symptoms, list of approved medications in pregnancy given - Patient reports need for dental care, letter of approval given  - S>D consistent with last exam, US since last OB visit shows growth at 50th%tile   Preterm labor symptoms and general obstetric precautions including but not limited to vaginal bleeding, contractions,  leaking of fluid and fetal movement were reviewed in detail with the patient. Please refer to After Visit Summary for other counseling recommendations.  Return in about 2 weeks (around 02/16/2016) for LOB, 2 hour GTT and 28 week labs.   Marny LowensteinJulie N Rohith Fauth, PA-C

## 2016-02-02 NOTE — Patient Instructions (Signed)
Second Trimester of Pregnancy The second trimester is from week 13 through week 28, month 4 through 6. This is often the time in pregnancy that you feel your best. Often times, morning sickness has lessened or quit. You may have more energy, and you may get hungry more often. Your unborn baby (fetus) is growing rapidly. At the end of the sixth month, he or she is about 9 inches long and weighs about 1 pounds. You will likely feel the baby move (quickening) between 18 and 20 weeks of pregnancy. Follow these instructions at home:  Avoid all smoking, herbs, and alcohol. Avoid drugs not approved by your doctor.  Do not use any tobacco products, including cigarettes, chewing tobacco, and electronic cigarettes. If you need help quitting, ask your doctor. You may get counseling or other support to help you quit.  Only take medicine as told by your doctor. Some medicines are safe and some are not during pregnancy.  Exercise only as told by your doctor. Stop exercising if you start having cramps.  Eat regular, healthy meals.  Wear a good support bra if your breasts are tender.  Do not use hot tubs, steam rooms, or saunas.  Wear your seat belt when driving.  Avoid raw meat, uncooked cheese, and liter boxes and soil used by cats.  Take your prenatal vitamins.  Take 1500-2000 milligrams of calcium daily starting at the 20th week of pregnancy until you deliver your baby.  Try taking medicine that helps you poop (stool softener) as needed, and if your doctor approves. Eat more fiber by eating fresh fruit, vegetables, and whole grains. Drink enough fluids to keep your pee (urine) clear or pale yellow.  Take warm water baths (sitz baths) to soothe pain or discomfort caused by hemorrhoids. Use hemorrhoid cream if your doctor approves.  If you have puffy, bulging veins (varicose veins), wear support hose. Raise (elevate) your feet for 15 minutes, 3-4 times a day. Limit salt in your diet.  Avoid heavy  lifting, wear low heals, and sit up straight.  Rest with your legs raised if you have leg cramps or low back pain.  Visit your dentist if you have not gone during your pregnancy. Use a soft toothbrush to brush your teeth. Be gentle when you floss.  You can have sex (intercourse) unless your doctor tells you not to.  Go to your doctor visits. Get help if:  You feel dizzy.  You have mild cramps or pressure in your lower belly (abdomen).  You have a nagging pain in your belly area.  You continue to feel sick to your stomach (nauseous), throw up (vomit), or have watery poop (diarrhea).  You have bad smelling fluid coming from your vagina.  You have pain with peeing (urination). Get help right away if:  You have a fever.  You are leaking fluid from your vagina.  You have spotting or bleeding from your vagina.  You have severe belly cramping or pain.  You lose or gain weight rapidly.  You have trouble catching your breath and have chest pain.  You notice sudden or extreme puffiness (swelling) of your face, hands, ankles, feet, or legs.  You have not felt the baby move in over an hour.  You have severe headaches that do not go away with medicine.  You have vision changes. This information is not intended to replace advice given to you by your health care provider. Make sure you discuss any questions you have with your health care   provider. Document Released: 04/07/2009 Document Revised: 06/19/2015 Document Reviewed: 03/14/2012 Elsevier Interactive Patient Education  2017 [employer]  

## 2016-02-16 ENCOUNTER — Ambulatory Visit (INDEPENDENT_AMBULATORY_CARE_PROVIDER_SITE_OTHER): Payer: BLUE CROSS/BLUE SHIELD | Admitting: Obstetrics & Gynecology

## 2016-02-16 VITALS — BP 106/70 | HR 75 | Wt 177.8 lb

## 2016-02-16 DIAGNOSIS — Z23 Encounter for immunization: Secondary | ICD-10-CM | POA: Diagnosis not present

## 2016-02-16 DIAGNOSIS — Z3402 Encounter for supervision of normal first pregnancy, second trimester: Secondary | ICD-10-CM

## 2016-02-16 LAB — CBC
HCT: 37.1 % (ref 35.0–45.0)
Hemoglobin: 12 g/dL (ref 11.7–15.5)
MCH: 31.7 pg (ref 27.0–33.0)
MCHC: 32.3 g/dL (ref 32.0–36.0)
MCV: 97.9 fL (ref 80.0–100.0)
MPV: 10.7 fL (ref 7.5–12.5)
PLATELETS: 224 10*3/uL (ref 140–400)
RBC: 3.79 MIL/uL — AB (ref 3.80–5.10)
RDW: 12.7 % (ref 11.0–15.0)
WBC: 9.8 10*3/uL (ref 3.8–10.8)

## 2016-02-16 LAB — HIV ANTIBODY (ROUTINE TESTING W REFLEX): HIV: NONREACTIVE

## 2016-02-16 LAB — 2HR GTT W 1 HR, CARPENTER, 75 G
Glucose, 1 Hr, Gest: 105 mg/dL (ref <180)
Glucose, 2 Hr, Gest: 110 mg/dL (ref <153)
Glucose, Fasting, Gest: 75 mg/dL (ref 65–91)

## 2016-02-16 NOTE — Progress Notes (Signed)
   PRENATAL VISIT NOTE  Subjective:  Mindy Russo is a 26 y.o. G2P1001 at 7832w0d being seen today for ongoing prenatal care.  She is currently monitored for the following issues for this low-risk pregnancy and has Supervision of normal pregnancy; Short interval between pregnancies affecting pregnancy, antepartum; and Rubella non-immune status, antepartum on her problem list.  Patient reports no complaints.  Contractions: Not present.  .  Movement: Present. Denies leaking of fluid.   The following portions of the patient's history were reviewed and updated as appropriate: allergies, current medications, past family history, past medical history, past social history, past surgical history and problem list. Problem list updated.  Objective:   Vitals:   02/16/16 0823  BP: 106/70  Pulse: 75  Weight: 177 lb 12.8 oz (80.6 kg)    Fetal Status: Fetal Heart Rate (bpm): 154   Movement: Present     General:  Alert, oriented and cooperative. Patient is in no acute distress.  Skin: Skin is warm and dry. No rash noted.   Cardiovascular: Normal heart rate noted  Respiratory: Normal respiratory effort, no problems with respiration noted  Abdomen: Soft, gravid, appropriate for gestational age. Pain/Pressure: Absent     Pelvic:  Cervical exam deferred        Extremities: Normal range of motion.  Edema: None  Mental Status: Normal mood and affect. Normal behavior. Normal judgment and thought content.   Assessment and Plan:  Pregnancy: G2P1001 at 8432w0d  1. Encounter for supervision of normal first pregnancy in second trimester - HIV antibody - RPR - CBC - 2Hr GTT w/ 1 Hr Carpenter 75 g  2. Need for immunization against influenza - Flu Vaccine QUAD 36+ mos IM (Fluarix, Quad PF)  Preterm labor symptoms and general obstetric precautions including but not limited to vaginal bleeding, contractions, leaking of fluid and fetal movement were reviewed in detail with the patient. Please refer to After  Visit Summary for other counseling recommendations.  Return in about 2 weeks (around 03/01/2016).   Lesly DukesKelly H Marlinda Miranda, MD

## 2016-02-17 LAB — RPR

## 2016-03-02 ENCOUNTER — Other Ambulatory Visit: Payer: BLUE CROSS/BLUE SHIELD | Admitting: Medical

## 2016-03-11 ENCOUNTER — Ambulatory Visit (INDEPENDENT_AMBULATORY_CARE_PROVIDER_SITE_OTHER): Payer: BLUE CROSS/BLUE SHIELD | Admitting: Student

## 2016-03-11 VITALS — BP 117/74 | HR 91 | Wt 186.1 lb

## 2016-03-11 DIAGNOSIS — Z3403 Encounter for supervision of normal first pregnancy, third trimester: Secondary | ICD-10-CM

## 2016-03-11 NOTE — Patient Instructions (Signed)
Third Trimester of Pregnancy The third trimester is from week 29 through week 40 (months 7 through 9). The third trimester is a time when the unborn baby (fetus) is growing rapidly. At the end of the ninth month, the fetus is about 20 inches in length and weighs 6-10 pounds. Body changes during your third trimester Your body goes through many changes during pregnancy. The changes vary from woman to woman. During the third trimester:  Your weight will continue to increase. You can expect to gain 25-35 pounds (11-16 kg) by the end of the pregnancy.  You may begin to get stretch marks on your hips, abdomen, and breasts.  You may urinate more often because the fetus is moving lower into your pelvis and pressing on your bladder.  You may develop or continue to have heartburn. This is caused by increased hormones that slow down muscles in the digestive tract.  You may develop or continue to have constipation because increased hormones slow digestion and cause the muscles that push waste through your intestines to relax.  You may develop hemorrhoids. These are swollen veins (varicose veins) in the rectum that can itch or be painful.  You may develop swollen, bulging veins (varicose veins) in your legs.  You may have increased body aches in the pelvis, back, or thighs. This is due to weight gain and increased hormones that are relaxing your joints.  You may have changes in your hair. These can include thickening of your hair, rapid growth, and changes in texture. Some women also have hair loss during or after pregnancy, or hair that feels dry or thin. Your hair will most likely return to normal after your baby is born.  Your breasts will continue to grow and they will continue to become tender. A yellow fluid (colostrum) may leak from your breasts. This is the first milk you are producing for your baby.  Your belly button may stick out.  You may notice more swelling in your hands, face, or  ankles.  You may have increased tingling or numbness in your hands, arms, and legs. The skin on your belly may also feel numb.  You may feel short of breath because of your expanding uterus.  You may have more problems sleeping. This can be caused by the size of your belly, increased need to urinate, and an increase in your body's metabolism.  You may notice the fetus "dropping," or moving lower in your abdomen.  You may have increased vaginal discharge.  Your cervix becomes thin and soft (effaced) near your due date. What to expect at prenatal visits You will have prenatal exams every 2 weeks until week 36. Then you will have weekly prenatal exams. During a routine prenatal visit:  You will be weighed to make sure you and the fetus are growing normally.  Your blood pressure will be taken.  Your abdomen will be measured to track your baby's growth.  The fetal heartbeat will be listened to.  Any test results from the previous visit will be discussed.  You may have a cervical check near your due date to see if you have effaced. At around 36 weeks, your health care provider will check your cervix. At the same time, your health care provider will also perform a test on the secretions of the vaginal tissue. This test is to determine if a type of bacteria, Group B streptococcus, is present. Your health care provider will explain this further. Your health care provider may ask you:    What your birth plan is.  How you are feeling.  If you are feeling the baby move.  If you have had any abnormal symptoms, such as leaking fluid, bleeding, severe headaches, or abdominal cramping.  If you are using any tobacco products, including cigarettes, chewing tobacco, and electronic cigarettes.  If you have any questions. Other tests or screenings that may be performed during your third trimester include:  Blood tests that check for low iron levels (anemia).  Fetal testing to check the health,  activity level, and growth of the fetus. Testing is done if you have certain medical conditions or if there are problems during the pregnancy.  Nonstress test (NST). This test checks the health of your baby to make sure there are no signs of problems, such as the baby not getting enough oxygen. During this test, a belt is placed around your belly. The baby is made to move, and its heart rate is monitored during movement. What is false labor? False labor is a condition in which you feel small, irregular tightenings of the muscles in the womb (contractions) that eventually go away. These are called Braxton Hicks contractions. Contractions may last for hours, days, or even weeks before true labor sets in. If contractions come at regular intervals, become more frequent, increase in intensity, or become painful, you should see your health care provider. What are the signs of labor?  Abdominal cramps.  Regular contractions that start at 10 minutes apart and become stronger and more frequent with time.  Contractions that start on the top of the uterus and spread down to the lower abdomen and back.  Increased pelvic pressure and dull back pain.  A watery or bloody mucus discharge that comes from the vagina.  Leaking of amniotic fluid. This is also known as your "water breaking." It could be a slow trickle or a gush. Let your doctor know if it has a color or strange odor. If you have any of these signs, call your health care provider right away, even if it is before your due date. Follow these instructions at home: Eating and drinking  Continue to eat regular, healthy meals.  Do not eat:  Raw meat or meat spreads.  Unpasteurized milk or cheese.  Unpasteurized juice.  Store-made salad.  Refrigerated smoked seafood.  Hot dogs or deli meat, unless they are piping hot.  More than 6 ounces of albacore tuna a week.  Shark, swordfish, king mackerel, or tile fish.  Store-made salads.  Raw  sprouts, such as mung bean or alfalfa sprouts.  Take prenatal vitamins as told by your health care provider.  Take 1000 mg of calcium daily as told by your health care provider.  If you develop constipation:  Take over-the-counter or prescription medicines.  Drink enough fluid to keep your urine clear or pale yellow.  Eat foods that are high in fiber, such as fresh fruits and vegetables, whole grains, and beans.  Limit foods that are high in fat and processed sugars, such as fried and sweet foods. Activity  Exercise only as directed by your health care provider. Healthy pregnant women should aim for 2 hours and 30 minutes of moderate exercise per week. If you experience any pain or discomfort while exercising, stop.  Avoid heavy lifting.  Do not exercise in extreme heat or humidity, or at high altitudes.  Wear low-heel, comfortable shoes.  Practice good posture.  Do not travel far distances unless it is absolutely necessary and only with the approval   of your health care provider.  Wear your seat belt at all times while in a car, on a bus, or on a plane.  Take frequent breaks and rest with your legs elevated if you have leg cramps or low back pain.  Do not use hot tubs, steam rooms, or saunas.  You may continue to have sex unless your health care provider tells you otherwise. Lifestyle  Do not use any products that contain nicotine or tobacco, such as cigarettes and e-cigarettes. If you need help quitting, ask your health care provider.  Do not drink alcohol.  Do not use any medicinal herbs or unprescribed drugs. These chemicals affect the formation and growth of the baby.  If you develop varicose veins:  Wear support pantyhose or compression stockings as told by your healthcare provider.  Elevate your feet for 15 minutes, 3-4 times a day.  Wear a supportive maternity bra to help with breast tenderness. General instructions  Take over-the-counter and prescription  medicines only as told by your health care provider. There are medicines that are either safe or unsafe to take during pregnancy.  Take warm sitz baths to soothe any pain or discomfort caused by hemorrhoids. Use hemorrhoid cream or witch hazel if your health care provider approves.  Avoid cat litter boxes and soil used by cats. These carry germs that can cause birth defects in the baby. If you have a cat, ask someone to clean the litter box for you.  To prepare for the arrival of your baby:  Take prenatal classes to understand, practice, and ask questions about the labor and delivery.  Make a trial run to the hospital.  Visit the hospital and tour the maternity area.  Arrange for maternity or paternity leave through employers.  Arrange for family and friends to take care of pets while you are in the hospital.  Purchase a rear-facing car seat and make sure you know how to install it in your car.  Pack your hospital bag.  Prepare the baby's nursery. Make sure to remove all pillows and stuffed animals from the baby's crib to prevent suffocation.  Visit your dentist if you have not gone during your pregnancy. Use a soft toothbrush to brush your teeth and be gentle when you floss.  Keep all prenatal follow-up visits as told by your health care provider. This is important. Contact a health care provider if:  You are unsure if you are in labor or if your water has broken.  You become dizzy.  You have mild pelvic cramps, pelvic pressure, or nagging pain in your abdominal area.  You have lower back pain.  You have persistent nausea, vomiting, or diarrhea.  You have an unusual or bad smelling vaginal discharge.  You have pain when you urinate. Get help right away if:  You have a fever.  You are leaking fluid from your vagina.  You have spotting or bleeding from your vagina.  You have severe abdominal pain or cramping.  You have rapid weight loss or weight gain.  You have  shortness of breath with chest pain.  You notice sudden or extreme swelling of your face, hands, ankles, feet, or legs.  Your baby makes fewer than 10 movements in 2 hours.  You have severe headaches that do not go away with medicine.  You have vision changes. Summary  The third trimester is from week 29 through week 40, months 7 through 9. The third trimester is a time when the unborn baby (fetus)   is growing rapidly.  During the third trimester, your discomfort may increase as you and your baby continue to gain weight. You may have abdominal, leg, and back pain, sleeping problems, and an increased need to urinate.  During the third trimester your breasts will keep growing and they will continue to become tender. A yellow fluid (colostrum) may leak from your breasts. This is the first milk you are producing for your baby.  False labor is a condition in which you feel small, irregular tightenings of the muscles in the womb (contractions) that eventually go away. These are called Braxton Hicks contractions. Contractions may last for hours, days, or even weeks before true labor sets in.  Signs of labor can include: abdominal cramps; regular contractions that start at 10 minutes apart and become stronger and more frequent with time; watery or bloody mucus discharge that comes from the vagina; increased pelvic pressure and dull back pain; and leaking of amniotic fluid. This information is not intended to replace advice given to you by your health care provider. Make sure you discuss any questions you have with your health care provider. Document Released: 01/05/2001 Document Revised: 06/19/2015 Document Reviewed: 03/14/2012 Elsevier Interactive Patient Education  2017 [employer]  

## 2016-03-11 NOTE — Progress Notes (Signed)
   PRENATAL VISIT NOTE  Subjective:  Mindy Russo is a 26 y.o. G2P1001 at 2353w3d being seen today for ongoing prenatal care.  She is currently monitored for the following issues for this low-risk pregnancy and has Supervision of normal pregnancy; Short interval between pregnancies affecting pregnancy, antepartum; and Rubella non-immune status, antepartum on her problem list.  Patient reports no complaints.  Contractions: Not present. Vag. Bleeding: None.  Movement: Present. Denies leaking of fluid.   The following portions of the patient's history were reviewed and updated as appropriate: allergies, current medications, past family history, past medical history, past social history, past surgical history and problem list. Problem list updated.  Objective:   Vitals:   03/11/16 1119  BP: 117/74  Pulse: 91  Weight: 84.4 kg (186 lb 1.6 oz)    Fetal Status: Fetal Heart Rate (bpm): 166 Fundal Height: 30 cm Movement: Present     General:  Alert, oriented and cooperative. Patient is in no acute distress.  Skin: Skin is warm and dry. No rash noted.   Cardiovascular: Normal heart rate noted  Respiratory: Normal respiratory effort, no problems with respiration noted  Abdomen: Soft, gravid, appropriate for gestational age. Pain/Pressure: Absent     Pelvic:  Cervical exam deferred        Extremities: Normal range of motion.  Edema: None  Mental Status: Normal mood and affect. Normal behavior. Normal judgment and thought content.   Assessment and Plan:  Pregnancy: G2P1001 at 3753w3d  There are no diagnoses linked to this encounter. Term labor symptoms and general obstetric precautions including but not limited to vaginal bleeding, contractions, leaking of fluid and fetal movement were reviewed in detail with the patient. Please refer to After Visit Summary for other counseling recommendations.  Return in about 2 weeks (around 03/25/2016) for ROB.   Marylene LandKathryn Lorraine Kooistra, CNM

## 2016-03-24 ENCOUNTER — Ambulatory Visit (INDEPENDENT_AMBULATORY_CARE_PROVIDER_SITE_OTHER): Payer: BLUE CROSS/BLUE SHIELD | Admitting: Obstetrics and Gynecology

## 2016-03-24 ENCOUNTER — Encounter: Payer: Self-pay | Admitting: Obstetrics and Gynecology

## 2016-03-24 VITALS — BP 122/69 | HR 83 | Wt 193.6 lb

## 2016-03-24 DIAGNOSIS — Z3483 Encounter for supervision of other normal pregnancy, third trimester: Secondary | ICD-10-CM

## 2016-03-24 DIAGNOSIS — Z283 Underimmunization status: Secondary | ICD-10-CM

## 2016-03-24 DIAGNOSIS — O9989 Other specified diseases and conditions complicating pregnancy, childbirth and the puerperium: Secondary | ICD-10-CM

## 2016-03-24 DIAGNOSIS — Z2839 Other underimmunization status: Secondary | ICD-10-CM

## 2016-03-24 DIAGNOSIS — O09899 Supervision of other high risk pregnancies, unspecified trimester: Secondary | ICD-10-CM

## 2016-03-24 NOTE — Patient Instructions (Signed)
Third Trimester of Pregnancy The third trimester is from week 28 through week 40 (months 7 through 9). The third trimester is a time when the unborn baby (fetus) is growing rapidly. At the end of the ninth month, the fetus is about 20 inches in length and weighs 6-10 pounds. Body changes during your third trimester Your body will continue to go through many changes during pregnancy. The changes vary from woman to woman. During the third trimester:  Your weight will continue to increase. You can expect to gain 25-35 pounds (11-16 kg) by the end of the pregnancy.  You may begin to get stretch marks on your hips, abdomen, and breasts.  You may urinate more often because the fetus is moving lower into your pelvis and pressing on your bladder.  You may develop or continue to have heartburn. This is caused by increased hormones that slow down muscles in the digestive tract.  You may develop or continue to have constipation because increased hormones slow digestion and cause the muscles that push waste through your intestines to relax.  You may develop hemorrhoids. These are swollen veins (varicose veins) in the rectum that can itch or be painful.  You may develop swollen, bulging veins (varicose veins) in your legs.  You may have increased body aches in the pelvis, back, or thighs. This is due to weight gain and increased hormones that are relaxing your joints.  You may have changes in your hair. These can include thickening of your hair, rapid growth, and changes in texture. Some women also have hair loss during or after pregnancy, or hair that feels dry or thin. Your hair will most likely return to normal after your baby is born.  Your breasts will continue to grow and they will continue to become tender. A yellow fluid (colostrum) may leak from your breasts. This is the first milk you are producing for your baby.  Your belly button may stick out.  You may notice more swelling in your hands,  face, or ankles.  You may have increased tingling or numbness in your hands, arms, and legs. The skin on your belly may also feel numb.  You may feel short of breath because of your expanding uterus.  You may have more problems sleeping. This can be caused by the size of your belly, increased need to urinate, and an increase in your body's metabolism.  You may notice the fetus "dropping," or moving lower in your abdomen (lightening).  You may have increased vaginal discharge.  You may notice your joints feel loose and you may have pain around your pelvic bone.  What to expect at prenatal visits You will have prenatal exams every 2 weeks until week 36. Then you will have weekly prenatal exams. During a routine prenatal visit:  You will be weighed to make sure you and the baby are growing normally.  Your blood pressure will be taken.  Your abdomen will be measured to track your baby's growth.  The fetal heartbeat will be listened to.  Any test results from the previous visit will be discussed.  You may have a cervical check near your due date to see if your cervix has softened or thinned (effaced).  You will be tested for Group B streptococcus. This happens between 35 and 37 weeks.  Your health care provider may ask you:  What your birth plan is.  How you are feeling.  If you are feeling the baby move.  If you have had   any abnormal symptoms, such as leaking fluid, bleeding, severe headaches, or abdominal cramping.  If you are using any tobacco products, including cigarettes, chewing tobacco, and electronic cigarettes.  If you have any questions.  Other tests or screenings that may be performed during your third trimester include:  Blood tests that check for low iron levels (anemia).  Fetal testing to check the health, activity level, and growth of the fetus. Testing is done if you have certain medical conditions or if there are problems during the  pregnancy.  Nonstress test (NST). This test checks the health of your baby to make sure there are no signs of problems, such as the baby not getting enough oxygen. During this test, a belt is placed around your belly. The baby is made to move, and its heart rate is monitored during movement.  What is false labor? False labor is a condition in which you feel small, irregular tightenings of the muscles in the womb (contractions) that usually go away with rest, changing position, or drinking water. These are called Braxton Hicks contractions. Contractions may last for hours, days, or even weeks before true labor sets in. If contractions come at regular intervals, become more frequent, increase in intensity, or become painful, you should see your health care provider. What are the signs of labor?  Abdominal cramps.  Regular contractions that start at 10 minutes apart and become stronger and more frequent with time.  Contractions that start on the top of the uterus and spread down to the lower abdomen and back.  Increased pelvic pressure and dull back pain.  A watery or bloody mucus discharge that comes from the vagina.  Leaking of amniotic fluid. This is also known as your "water breaking." It could be a slow trickle or a gush. Let your health care provider know if it has a color or strange odor. If you have any of these signs, call your health care provider right away, even if it is before your due date. Follow these instructions at home: Medicines  Follow your health care provider's instructions regarding medicine use. Specific medicines may be either safe or unsafe to take during pregnancy.  Take a prenatal vitamin that contains at least 600 micrograms (mcg) of folic acid.  If you develop constipation, try taking a stool softener if your health care provider approves. Eating and drinking  Eat a balanced diet that includes fresh fruits and vegetables, whole grains, good sources of protein  such as meat, eggs, or tofu, and low-fat dairy. Your health care provider will help you determine the amount of weight gain that is right for you.  Avoid raw meat and uncooked cheese. These carry germs that can cause birth defects in the baby.  If you have low calcium intake from food, talk to your health care provider about whether you should take a daily calcium supplement.  Eat four or five small meals rather than three large meals a day.  Limit foods that are high in fat and processed sugars, such as fried and sweet foods.  To prevent constipation: ? Drink enough fluid to keep your urine clear or pale yellow. ? Eat foods that are high in fiber, such as fresh fruits and vegetables, whole grains, and beans. Activity  Exercise only as directed by your health care provider. Most women can continue their usual exercise routine during pregnancy. Try to exercise for 30 minutes at least 5 days a week. Stop exercising if you experience uterine contractions.  Avoid heavy   lifting.  Do not exercise in extreme heat or humidity, or at high altitudes.  Wear low-heel, comfortable shoes.  Practice good posture.  You may continue to have sex unless your health care provider tells you otherwise. Relieving pain and discomfort  Take frequent breaks and rest with your legs elevated if you have leg cramps or low back pain.  Take warm sitz baths to soothe any pain or discomfort caused by hemorrhoids. Use hemorrhoid cream if your health care provider approves.  Wear a good support bra to prevent discomfort from breast tenderness.  If you develop varicose veins: ? Wear support pantyhose or compression stockings as told by your healthcare provider. ? Elevate your feet for 15 minutes, 3-4 times a day. Prenatal care  Write down your questions. Take them to your prenatal visits.  Keep all your prenatal visits as told by your health care provider. This is important. Safety  Wear your seat belt at  all times when driving.  Make a list of emergency phone numbers, including numbers for family, friends, the hospital, and police and fire departments. General instructions  Avoid cat litter boxes and soil used by cats. These carry germs that can cause birth defects in the baby. If you have a cat, ask someone to clean the litter box for you.  Do not travel far distances unless it is absolutely necessary and only with the approval of your health care provider.  Do not use hot tubs, steam rooms, or saunas.  Do not drink alcohol.  Do not use any products that contain nicotine or tobacco, such as cigarettes and e-cigarettes. If you need help quitting, ask your health care provider.  Do not use any medicinal herbs or unprescribed drugs. These chemicals affect the formation and growth of the baby.  Do not douche or use tampons or scented sanitary pads.  Do not cross your legs for long periods of time.  To prepare for the arrival of your baby: ? Take prenatal classes to understand, practice, and ask questions about labor and delivery. ? Make a trial run to the hospital. ? Visit the hospital and tour the maternity area. ? Arrange for maternity or paternity leave through employers. ? Arrange for family and friends to take care of pets while you are in the hospital. ? Purchase a rear-facing car seat and make sure you know how to install it in your car. ? Pack your hospital bag. ? Prepare the baby's nursery. Make sure to remove all pillows and stuffed animals from the baby's crib to prevent suffocation.  Visit your dentist if you have not gone during your pregnancy. Use a soft toothbrush to brush your teeth and be gentle when you floss. Contact a health care provider if:  You are unsure if you are in labor or if your water has broken.  You become dizzy.  You have mild pelvic cramps, pelvic pressure, or nagging pain in your abdominal area.  You have lower back pain.  You have persistent  nausea, vomiting, or diarrhea.  You have an unusual or bad smelling vaginal discharge.  You have pain when you urinate. Get help right away if:  Your water breaks before 37 weeks.  You have regular contractions less than 5 minutes apart before 37 weeks.  You have a fever.  You are leaking fluid from your vagina.  You have spotting or bleeding from your vagina.  You have severe abdominal pain or cramping.  You have rapid weight loss or weight gain.    You have shortness of breath with chest pain.  You notice sudden or extreme swelling of your face, hands, ankles, feet, or legs.  Your baby makes fewer than 10 movements in 2 hours.  You have severe headaches that do not go away when you take medicine.  You have vision changes. Summary  The third trimester is from week 28 through week 40, months 7 through 9. The third trimester is a time when the unborn baby (fetus) is growing rapidly.  During the third trimester, your discomfort may increase as you and your baby continue to gain weight. You may have abdominal, leg, and back pain, sleeping problems, and an increased need to urinate.  During the third trimester your breasts will keep growing and they will continue to become tender. A yellow fluid (colostrum) may leak from your breasts. This is the first milk you are producing for your baby.  False labor is a condition in which you feel small, irregular tightenings of the muscles in the womb (contractions) that eventually go away. These are called Braxton Hicks contractions. Contractions may last for hours, days, or even weeks before true labor sets in.  Signs of labor can include: abdominal cramps; regular contractions that start at 10 minutes apart and become stronger and more frequent with time; watery or bloody mucus discharge that comes from the vagina; increased pelvic pressure and dull back pain; and leaking of amniotic fluid. This information is not intended to replace advice  given to you by your health care provider. Make sure you discuss any questions you have with your health care provider. Document Released: 01/05/2001 Document Revised: 06/19/2015 Document Reviewed: 03/14/2012 Elsevier Interactive Patient Education  2017 [employer]  

## 2016-03-24 NOTE — Progress Notes (Signed)
Subjective:  Mindy Russo is a 26 y.o. G2P1001 at 8330w2d being seen today for ongoing prenatal care.  She is currently monitored for the following issues for this low-risk pregnancy and has Supervision of normal pregnancy; Short interval between pregnancies affecting pregnancy, antepartum; and Rubella non-immune status, antepartum on her problem list.  Patient reports carpal tunnel symptoms.  Contractions: Not present. Vag. Bleeding: None.  Movement: Present. Denies leaking of fluid.   The following portions of the patient's history were reviewed and updated as appropriate: allergies, current medications, past family history, past medical history, past social history, past surgical history and problem list. Problem list updated.  Objective:   Vitals:   03/24/16 1614  BP: 122/69  Pulse: 83  Weight: 193 lb 9.6 oz (87.8 kg)    Fetal Status: Fetal Heart Rate (bpm): 160   Movement: Present     General:  Alert, oriented and cooperative. Patient is in no acute distress.  Skin: Skin is warm and dry. No rash noted.   Cardiovascular: Normal heart rate noted  Respiratory: Normal respiratory effort, no problems with respiration noted  Abdomen: Soft, gravid, appropriate for gestational age. Pain/Pressure: Absent     Pelvic:  Cervical exam deferred        Extremities: Normal range of motion.  Edema: None  Mental Status: Normal mood and affect. Normal behavior. Normal judgment and thought content.   Urinalysis:      Assessment and Plan:  Pregnancy: G2P1001 at 5830w2d  1. Encounter for supervision of other normal pregnancy in third trimester stable Work note to limit lift to < 25 # 2. Short interval between pregnancies affecting pregnancy, antepartum   3. Rubella non-immune status, antepartum Vaccine PP  Preterm labor symptoms and general obstetric precautions including but not limited to vaginal bleeding, contractions, leaking of fluid and fetal movement were reviewed in detail with the  patient. Please refer to After Visit Summary for other counseling recommendations.  Return in about 2 weeks (around 04/07/2016) for OB visit.   Hermina StaggersMichael L Cordale Manera, MD

## 2016-04-07 ENCOUNTER — Ambulatory Visit (INDEPENDENT_AMBULATORY_CARE_PROVIDER_SITE_OTHER): Payer: BLUE CROSS/BLUE SHIELD | Admitting: Medical

## 2016-04-07 VITALS — BP 97/51 | HR 83 | Wt 197.6 lb

## 2016-04-07 DIAGNOSIS — Z3483 Encounter for supervision of other normal pregnancy, third trimester: Secondary | ICD-10-CM

## 2016-04-07 NOTE — Progress Notes (Signed)
   PRENATAL VISIT NOTE  Subjective:  Mindy Russo is a 26 y.o. G2P1001 at 3968w2d being seen today for ongoing prenatal care.  She is currently monitored for the following issues for this low-risk pregnancy and has Supervision of normal pregnancy; Short interval between pregnancies affecting pregnancy, antepartum; and Rubella non-immune status, antepartum on her problem list.  Patient reports no complaints.  Contractions: Not present.  .  Movement: Present. Denies leaking of fluid.   The following portions of the patient's history were reviewed and updated as appropriate: allergies, current medications, past family history, past medical history, past social history, past surgical history and problem list. Problem list updated.  Objective:   Vitals:   04/07/16 1446  BP: (!) 97/51  Pulse: 83  Weight: 197 lb 9.6 oz (89.6 kg)    Fetal Status: Fetal Heart Rate (bpm): 142 Fundal Height: 34 cm Movement: Present     General:  Alert, oriented and cooperative. Patient is in no acute distress.  Skin: Skin is warm and dry. No rash noted.   Cardiovascular: Normal heart rate noted  Respiratory: Normal respiratory effort, no problems with respiration noted  Abdomen: Soft, gravid, appropriate for gestational age. Pain/Pressure: Absent     Pelvic:  Cervical exam deferred        Extremities: Normal range of motion.  Edema: None  Mental Status: Normal mood and affect. Normal behavior. Normal judgment and thought content.   Assessment and Plan:  Pregnancy: G2P1001 at 9868w2d  1. Encounter for supervision of other normal pregnancy in third trimester - Patient doing well - Has had LE edema, working 12 hour shifts 3 days/week, had Pre-eclampsia evaluation and outside hospital, negative per patient - GBS and GC/Chlamydia at next visit   Preterm labor symptoms and general obstetric precautions including but not limited to vaginal bleeding, contractions, leaking of fluid and fetal movement were reviewed  in detail with the patient. Please refer to After Visit Summary for other counseling recommendations.  Return in about 1 week (around 04/14/2016) for LOB.   Marny LowensteinJulie N Madden Garron, PA-C

## 2016-04-07 NOTE — Patient Instructions (Signed)
Fetal Movement Counts Patient Name: ________________________________________________ Patient Due Date: ____________________ What is a fetal movement count? A fetal movement count is the number of times that you feel your baby move during a certain amount of time. This may also be called a fetal kick count. A fetal movement count is recommended for every pregnant woman. You may be asked to start counting fetal movements as early as week 28 of your pregnancy. Pay attention to when your baby is most active. You may notice your baby's sleep and wake cycles. You may also notice things that make your baby move more. You should do a fetal movement count:  When your baby is normally most active.  At the same time each day. A good time to count movements is while you are resting, after having something to eat and drink. How do I count fetal movements? 1. Find a quiet, comfortable area. Sit, or lie down on your side. 2. Write down the date, the start time and stop time, and the number of movements that you felt between those two times. Take this information with you to your health care visits. 3. For 2 hours, count kicks, flutters, swishes, rolls, and jabs. You should feel at least 10 movements during 2 hours. 4. You may stop counting after you have felt 10 movements. 5. If you do not feel 10 movements in 2 hours, have something to eat and drink. Then, keep resting and counting for 1 hour. If you feel at least 4 movements during that hour, you may stop counting. Contact a health care provider if:  You feel fewer than 4 movements in 2 hours.  Your baby is not moving like he or she usually does. Date: ____________ Start time: ____________ Stop time: ____________ Movements: ____________ Date: ____________ Start time: ____________ Stop time: ____________ Movements: ____________ Date: ____________ Start time: ____________ Stop time: ____________ Movements: ____________ Date: ____________ Start time:  ____________ Stop time: ____________ Movements: ____________ Date: ____________ Start time: ____________ Stop time: ____________ Movements: ____________ Date: ____________ Start time: ____________ Stop time: ____________ Movements: ____________ Date: ____________ Start time: ____________ Stop time: ____________ Movements: ____________ Date: ____________ Start time: ____________ Stop time: ____________ Movements: ____________ Date: ____________ Start time: ____________ Stop time: ____________ Movements: ____________ This information is not intended to replace advice given to you by your health care provider. Make sure you discuss any questions you have with your health care provider. Document Released: 02/10/2006 Document Revised: 09/10/2015 Document Reviewed: 02/20/2015 Elsevier Interactive Patient Education  2017 [employer] Braxton Hicks Contractions Contractions of the uterus can occur throughout pregnancy, but they are not always a sign that you are in labor. You may have practice contractions called Braxton Hicks contractions. These false labor contractions are sometimes confused with true labor. What are Braxton Hicks contractions? Braxton Hicks contractions are tightening movements that occur in the muscles of the uterus before labor. Unlike true labor contractions, these contractions do not result in opening (dilation) and thinning of the cervix. Toward the end of pregnancy (32-34 weeks), Braxton Hicks contractions can happen more often and may become stronger. These contractions are sometimes difficult to tell apart from true labor because they can be very uncomfortable. You should not feel embarrassed if you go to the hospital with false labor. Sometimes, the only way to tell if you are in true labor is for your health care provider to look for changes in the cervix. The health care provider will do a physical exam and may monitor your contractions. If you   are not in true labor, the exam  should show that your cervix is not dilating and your water has not broken. If there are no prenatal problems or other health problems associated with your pregnancy, it is completely safe for you to be sent home with false labor. You may continue to have Braxton Hicks contractions until you go into true labor. How can I tell the difference between true labor and false labor?  Differences  False labor  Contractions last 30-70 seconds.: Contractions are usually shorter and not as strong as true labor contractions.  Contractions become very regular.: Contractions are usually irregular.  Discomfort is usually felt in the top of the uterus, and it spreads to the lower abdomen and low back.: Contractions are often felt in the front of the lower abdomen and in the groin.  Contractions do not go away with walking.: Contractions may go away when you walk around or change positions while lying down.  Contractions usually become more intense and increase in frequency.: Contractions get weaker and are shorter-lasting as time goes on.  The cervix dilates and gets thinner.: The cervix usually does not dilate or become thin. Follow these instructions at home:  Take over-the-counter and prescription medicines only as told by your health care provider.  Keep up with your usual exercises and follow other instructions from your health care provider.  Eat and drink lightly if you think you are going into labor.  If Braxton Hicks contractions are making you uncomfortable:  Change your position from lying down or resting to walking, or change from walking to resting.  Sit and rest in a tub of warm water.  Drink enough fluid to keep your urine clear or pale yellow. Dehydration may cause these contractions.  Do slow and deep breathing several times an hour.  Keep all follow-up prenatal visits as told by your health care provider. This is important. Contact a health care provider if:  You have a  fever.  You have continuous pain in your abdomen. Get help right away if:  Your contractions become stronger, more regular, and closer together.  You have fluid leaking or gushing from your vagina.  You pass blood-tinged mucus (bloody show).  You have bleeding from your vagina.  You have low back pain that you never had before.  You feel your baby's head pushing down and causing pelvic pressure.  Your baby is not moving inside you as much as it used to. Summary  Contractions that occur before labor are called Braxton Hicks contractions, false labor, or practice contractions.  Braxton Hicks contractions are usually shorter, weaker, farther apart, and less regular than true labor contractions. True labor contractions usually become progressively stronger and regular and they become more frequent.  Manage discomfort from Braxton Hicks contractions by changing position, resting in a warm bath, drinking plenty of water, or practicing deep breathing. This information is not intended to replace advice given to you by your health care provider. Make sure you discuss any questions you have with your health care provider. Document Released: 01/11/2005 Document Revised: 12/01/2015 Document Reviewed: 12/01/2015 Elsevier Interactive Patient Education  2017 [employer]  

## 2016-04-12 NOTE — Addendum Note (Signed)
Addended by: Cheree DittoGRAHAM, Bralynn Velador A on: 04/12/2016 10:51 AM   Modules accepted: Orders

## 2016-04-15 ENCOUNTER — Ambulatory Visit (INDEPENDENT_AMBULATORY_CARE_PROVIDER_SITE_OTHER): Payer: BLUE CROSS/BLUE SHIELD | Admitting: Advanced Practice Midwife

## 2016-04-15 ENCOUNTER — Encounter: Payer: Self-pay | Admitting: Advanced Practice Midwife

## 2016-04-15 VITALS — BP 113/69 | HR 92

## 2016-04-15 DIAGNOSIS — Z3493 Encounter for supervision of normal pregnancy, unspecified, third trimester: Secondary | ICD-10-CM

## 2016-04-15 DIAGNOSIS — Z113 Encounter for screening for infections with a predominantly sexual mode of transmission: Secondary | ICD-10-CM | POA: Diagnosis not present

## 2016-04-15 LAB — OB RESULTS CONSOLE GBS: GBS: POSITIVE

## 2016-04-15 LAB — OB RESULTS CONSOLE GC/CHLAMYDIA: Gonorrhea: NEGATIVE

## 2016-04-15 NOTE — Progress Notes (Signed)
   PRENATAL VISIT NOTE  Subjective:  Mindy Russo is a 26 y.o. G2P1001 at 4980w3d being seen today for ongoing prenatal care.  She is currently monitored for the following issues for this low-risk pregnancy and has Supervision of normal pregnancy; Short interval between pregnancies affecting pregnancy, antepartum; and Rubella non-immune status, antepartum on her problem list.  Patient reports no complaints.  Contractions: Not present. Vag. Bleeding: None.  Movement: Present. Denies leaking of fluid.   The following portions of the patient's history were reviewed and updated as appropriate: allergies, current medications, past family history, past medical history, past social history, past surgical history and problem list. Problem list updated.  Objective:   Vitals:   04/15/16 1425  BP: 113/69  Pulse: 92    Fetal Status: Fetal Heart Rate (bpm): 156   Movement: Present     General:  Alert, oriented and cooperative. Patient is in no acute distress.  Skin: Skin is warm and dry. No rash noted.   Cardiovascular: Normal heart rate noted  Respiratory: Normal respiratory effort, no problems with respiration noted  Abdomen: Soft, gravid, appropriate for gestational age. Pain/Pressure: Absent     Pelvic:  Cervical exam performed       Difficult exam unable to feel.  Probably closed per what I could palpate  Extremities: Normal range of motion.  Edema: Trace  Mental Status: Normal mood and affect. Normal behavior. Normal judgment and thought content.   Assessment and Plan:  Pregnancy: G2P1001 at 3380w3d  1. Normal pregnancy, third trimester  - GC[facility]Chlamydia [facility] amp [facility] - Culture, beta strep (group b only)  Term labor symptoms and general obstetric precautions including but not limited to vaginal bleeding, contractions, leaking of fluid and fetal movement were reviewed in detail with the patient. Please refer to After Visit Summary for other counseling recommendations.    Return in about 1 week (around 04/22/2016) for [facility].   Aviva SignsMarie L Shanece Cochrane, CNM

## 2016-04-15 NOTE — Patient Instructions (Signed)

## 2016-04-16 LAB — GC/CHLAMYDIA PROBE AMP (~~LOC~~) NOT AT ARMC
Chlamydia: NEGATIVE
Neisseria Gonorrhea: NEGATIVE

## 2016-04-18 LAB — CULTURE, BETA STREP (GROUP B ONLY): Strep Gp B Culture: POSITIVE — AB

## 2016-04-25 ENCOUNTER — Encounter (HOSPITAL_COMMUNITY): Payer: Self-pay | Admitting: Advanced Practice Midwife

## 2016-04-26 ENCOUNTER — Ambulatory Visit (INDEPENDENT_AMBULATORY_CARE_PROVIDER_SITE_OTHER): Payer: BLUE CROSS/BLUE SHIELD | Admitting: Family Medicine

## 2016-04-26 VITALS — BP 113/70 | HR 87 | Wt 206.5 lb

## 2016-04-26 DIAGNOSIS — O99820 Streptococcus B carrier state complicating pregnancy: Secondary | ICD-10-CM

## 2016-04-26 DIAGNOSIS — Z3483 Encounter for supervision of other normal pregnancy, third trimester: Secondary | ICD-10-CM

## 2016-04-26 NOTE — Progress Notes (Signed)
   PRENATAL VISIT NOTE  Subjective:  Mindy Russo is a 26 y.o. G2P1001 at [redacted]w[redacted]d being seen today for ongoing prenatal care.  She is currently monitored for the following issues for this low-risk pregnancy and has Supervision of normal pregnancy; GBS (group B Streptococcus carrier), +RV culture, currently pregnant; and Rubella non-immune status, antepartum on her problem list.  Patient reports no complaints.  Contractions: Not present. Vag. Bleeding: None.  Movement: Present. Denies leaking of fluid.   The following portions of the patient's history were reviewed and updated as appropriate: allergies, current medications, past family history, past medical history, past social history, past surgical history and problem list. Problem list updated.  Objective:   Vitals:   04/26/16 1433  BP: 113/70  Pulse: 87  Weight: 206 lb 8 oz (93.7 kg)    Fetal Status: Fetal Heart Rate (bpm): 155 Fundal Height: 37 cm Movement: Present     General:  Alert, oriented and cooperative. Patient is in no acute distress.  Skin: Skin is warm and dry. No rash noted.   Cardiovascular: Normal heart rate noted  Respiratory: Normal respiratory effort, no problems with respiration noted  Abdomen: Soft, gravid, appropriate for gestational age. Pain/Pressure: Present     Pelvic:  Cervical exam deferred        Extremities: Normal range of motion.  Edema: Trace  Mental Status: Normal mood and affect. Normal behavior. Normal judgment and thought content.   Assessment and Plan:  Pregnancy: G2P1001 at [redacted]w[redacted]d  1. Encounter for supervision of other normal pregnancy in third trimester FHT and FH normal  2. GBS (group B Streptococcus carrier), +RV culture, currently pregnant Intrapartum treatment.  Term labor symptoms and general obstetric precautions including but not limited to vaginal bleeding, contractions, leaking of fluid and fetal movement were reviewed in detail with the patient. Please refer to After Visit  Summary for other counseling recommendations.  Return in about 1 week (around 05/03/2016) for LR OB f/u.   Levie Heritage, DO

## 2016-05-03 ENCOUNTER — Inpatient Hospital Stay (HOSPITAL_COMMUNITY): Payer: BLUE CROSS/BLUE SHIELD | Admitting: Anesthesiology

## 2016-05-03 ENCOUNTER — Inpatient Hospital Stay (HOSPITAL_COMMUNITY)
Admission: AD | Admit: 2016-05-03 | Discharge: 2016-05-06 | DRG: 774 | Disposition: A | Discharge status: Home / Self Care | Payer: BLUE CROSS/BLUE SHIELD | Source: Ambulatory Visit | Attending: Obstetrics & Gynecology | Admitting: Obstetrics & Gynecology

## 2016-05-03 ENCOUNTER — Ambulatory Visit (INDEPENDENT_AMBULATORY_CARE_PROVIDER_SITE_OTHER): Payer: BLUE CROSS/BLUE SHIELD | Admitting: Advanced Practice Midwife

## 2016-05-03 VITALS — BP 140/73 | HR 105 | Wt 211.0 lb

## 2016-05-03 DIAGNOSIS — D649 Anemia, unspecified: Secondary | ICD-10-CM | POA: Diagnosis present

## 2016-05-03 DIAGNOSIS — R030 Elevated blood-pressure reading, without diagnosis of hypertension: Secondary | ICD-10-CM | POA: Diagnosis present

## 2016-05-03 DIAGNOSIS — O1002 Pre-existing essential hypertension complicating childbirth: Secondary | ICD-10-CM | POA: Diagnosis present

## 2016-05-03 DIAGNOSIS — O99820 Streptococcus B carrier state complicating pregnancy: Secondary | ICD-10-CM

## 2016-05-03 DIAGNOSIS — O99824 Streptococcus B carrier state complicating childbirth: Secondary | ICD-10-CM | POA: Diagnosis present

## 2016-05-03 DIAGNOSIS — O9902 Anemia complicating childbirth: Secondary | ICD-10-CM | POA: Diagnosis present

## 2016-05-03 DIAGNOSIS — Z3A39 39 weeks gestation of pregnancy: Secondary | ICD-10-CM | POA: Diagnosis not present

## 2016-05-03 DIAGNOSIS — O139 Gestational [pregnancy-induced] hypertension without significant proteinuria, unspecified trimester: Secondary | ICD-10-CM | POA: Diagnosis present

## 2016-05-03 DIAGNOSIS — O1092 Unspecified pre-existing hypertension complicating childbirth: Secondary | ICD-10-CM | POA: Diagnosis not present

## 2016-05-03 DIAGNOSIS — Z833 Family history of diabetes mellitus: Secondary | ICD-10-CM

## 2016-05-03 DIAGNOSIS — Z3483 Encounter for supervision of other normal pregnancy, third trimester: Secondary | ICD-10-CM

## 2016-05-03 DIAGNOSIS — O133 Gestational [pregnancy-induced] hypertension without significant proteinuria, third trimester: Secondary | ICD-10-CM

## 2016-05-03 LAB — COMPREHENSIVE METABOLIC PANEL
ALBUMIN: 2.8 g/dL — AB (ref 3.5–5.0)
ALT: 16 U/L (ref 14–54)
ANION GAP: 7 (ref 5–15)
AST: 18 U/L (ref 15–41)
Alkaline Phosphatase: 154 U/L — ABNORMAL HIGH (ref 38–126)
BUN: 9 mg/dL (ref 6–20)
CO2: 19 mmol/L — AB (ref 22–32)
Calcium: 8.6 mg/dL — ABNORMAL LOW (ref 8.9–10.3)
Chloride: 108 mmol/L (ref 101–111)
Creatinine, Ser: 0.67 mg/dL (ref 0.44–1.00)
GFR calc Af Amer: >60 mL/min (ref >60)
GFR calc non Af Amer: >60 mL/min (ref >60)
GLUCOSE: 114 mg/dL — AB (ref 65–99)
POTASSIUM: 4.1 mmol/L (ref 3.5–5.1)
SODIUM: 134 mmol/L — AB (ref 135–145)
TOTAL PROTEIN: 6.2 g/dL — AB (ref 6.5–8.1)
Total Bilirubin: 0.7 mg/dL (ref 0.3–1.2)

## 2016-05-03 LAB — CBC
HCT: 34.6 % — ABNORMAL LOW (ref 36.0–46.0)
Hemoglobin: 11.4 g/dL — ABNORMAL LOW (ref 12.0–15.0)
MCH: 30.4 pg (ref 26.0–34.0)
MCHC: 32.9 g/dL (ref 30.0–36.0)
MCV: 92.3 fL (ref 78.0–100.0)
Platelets: 204 10*3/uL (ref 150–400)
RBC: 3.75 MIL/uL — ABNORMAL LOW (ref 3.87–5.11)
RDW: 14.2 % (ref 11.5–15.5)
WBC: 12.9 10*3/uL — ABNORMAL HIGH (ref 4.0–10.5)

## 2016-05-03 LAB — POCT URINALYSIS DIP (DEVICE)
BILIRUBIN URINE: NEGATIVE
GLUCOSE, UA: 100 mg/dL — AB
Hgb urine dipstick: NEGATIVE
NITRITE: NEGATIVE
PH: 7 (ref 5.0–8.0)
Protein, ur: 100 mg/dL — AB
Specific Gravity, Urine: 1.025 (ref 1.005–1.030)
Urobilinogen, UA: 2 mg/dL — ABNORMAL HIGH (ref 0.0–1.0)

## 2016-05-03 LAB — TYPE AND SCREEN
ABO/RH(D): B POS
Antibody Screen: NEGATIVE

## 2016-05-03 LAB — PROTEIN / CREATININE RATIO, URINE
Creatinine, Urine: 87 mg/dL
PROTEIN CREATININE RATIO: 0.24 mg/mg{creat} — AB (ref 0.00–0.15)
Total Protein, Urine: 21 mg/dL

## 2016-05-03 MED ORDER — LIDOCAINE HCL (PF) 1 % IJ SOLN
INTRAMUSCULAR | Status: DC | PRN
  Administered 2016-05-03 (×2): 6 mL via EPIDURAL

## 2016-05-03 MED ORDER — LACTATED RINGERS IV SOLN: 500.0000 mL | Freq: Once | INTRAVENOUS | Status: DC

## 2016-05-03 MED ORDER — EPHEDRINE 5 MG/ML INJ
10.0000 mg | INTRAVENOUS | Status: DC | PRN
  Filled 2016-05-03: qty 2

## 2016-05-03 MED ORDER — PHENYLEPHRINE 40 MCG/ML (10ML) SYRINGE FOR IV PUSH (FOR BLOOD PRESSURE SUPPORT): 80.0000 ug | PREFILLED_SYRINGE | INTRAVENOUS | Status: DC | PRN

## 2016-05-03 MED ORDER — OXYCODONE-ACETAMINOPHEN 5-325 MG PO TABS: 1.0000 | ORAL_TABLET | ORAL | Status: DC | PRN

## 2016-05-03 MED ORDER — PHENYLEPHRINE 40 MCG/ML (10ML) SYRINGE FOR IV PUSH (FOR BLOOD PRESSURE SUPPORT)
80.0000 ug | PREFILLED_SYRINGE | INTRAVENOUS | Status: DC | PRN
  Filled 2016-05-03: qty 10
  Filled 2016-05-03: qty 5

## 2016-05-03 MED ORDER — OXYTOCIN 40 UNITS IN LACTATED RINGERS INFUSION - SIMPLE MED
2.5000 [IU]/h | INTRAVENOUS | Status: DC
  Administered 2016-05-04: 2.5 [IU]/h via INTRAVENOUS

## 2016-05-03 MED ORDER — PENICILLIN G POTASSIUM 5000000 UNITS IJ SOLR
5.0000 10*6.[IU] | Freq: Once | INTRAVENOUS | Status: AC
  Administered 2016-05-03: 5 10*6.[IU] via INTRAVENOUS
  Filled 2016-05-03: qty 5

## 2016-05-03 MED ORDER — LIDOCAINE HCL (PF) 1 % IJ SOLN
30.0000 mL | INTRAMUSCULAR | Status: DC | PRN
  Filled 2016-05-03: qty 30

## 2016-05-03 MED ORDER — DIPHENHYDRAMINE HCL 50 MG/ML IJ SOLN
12.5000 mg | INTRAMUSCULAR | Status: DC | PRN
Start: 2016-05-03 — End: 2016-05-03

## 2016-05-03 MED ORDER — LACTATED RINGERS IV SOLN
INTRAVENOUS | Status: DC
  Administered 2016-05-03 – 2016-05-04 (×3): via INTRAVENOUS

## 2016-05-03 MED ORDER — ONDANSETRON HCL 4 MG/2ML IJ SOLN
4.0000 mg | Freq: Four times a day (QID) | INTRAMUSCULAR | Status: DC | PRN
Start: 2016-05-03 — End: 2016-05-04

## 2016-05-03 MED ORDER — SOD CITRATE-CITRIC ACID 500-334 MG/5ML PO SOLN: 30.0000 mL | ORAL | Status: DC | PRN

## 2016-05-03 MED ORDER — OXYCODONE-ACETAMINOPHEN 5-325 MG PO TABS: 2.0000 | ORAL_TABLET | ORAL | Status: DC | PRN

## 2016-05-03 MED ORDER — EPHEDRINE 5 MG/ML INJ: 10.0000 mg | INTRAVENOUS | Status: DC | PRN

## 2016-05-03 MED ORDER — FENTANYL 2.5 MCG/ML BUPIVACAINE 1/10 % EPIDURAL INFUSION (WH - ANES)
14.0000 mL/h | INTRAMUSCULAR | Status: DC | PRN
  Administered 2016-05-03 (×2): 14 mL/h via EPIDURAL
  Filled 2016-05-03: qty 100

## 2016-05-03 MED ORDER — LACTATED RINGERS IV SOLN
500.0000 mL | Freq: Once | INTRAVENOUS | Status: AC
  Administered 2016-05-03: 500 mL via INTRAVENOUS

## 2016-05-03 MED ORDER — LACTATED RINGERS IV SOLN
500.0000 mL | INTRAVENOUS | Status: DC | PRN
  Administered 2016-05-03: 500 mL via INTRAVENOUS

## 2016-05-03 MED ORDER — OXYTOCIN BOLUS FROM INFUSION: 500.0000 mL | Freq: Once | INTRAVENOUS | Status: DC

## 2016-05-03 MED ORDER — PENICILLIN G POT IN DEXTROSE 60000 UNIT/ML IV SOLN
3.0000 10*6.[IU] | INTRAVENOUS | Status: DC
  Administered 2016-05-03 – 2016-05-04 (×2): 3 10*6.[IU] via INTRAVENOUS
  Filled 2016-05-03 (×5): qty 50

## 2016-05-03 MED ORDER — FENTANYL CITRATE (PF) 100 MCG/2ML IJ SOLN
100.0000 ug | INTRAMUSCULAR | Status: DC | PRN
  Administered 2016-05-03: 100 ug via INTRAVENOUS
  Filled 2016-05-03: qty 2

## 2016-05-03 MED ORDER — ACETAMINOPHEN 325 MG PO TABS: 650.0000 mg | ORAL_TABLET | ORAL | Status: DC | PRN

## 2016-05-03 MED ORDER — DIPHENHYDRAMINE HCL 50 MG/ML IJ SOLN: 12.5000 mg | INTRAMUSCULAR | Status: DC | PRN

## 2016-05-03 MED ORDER — OXYTOCIN 40 UNITS IN LACTATED RINGERS INFUSION - SIMPLE MED
1.0000 m[IU]/min | INTRAVENOUS | Status: DC
  Administered 2016-05-03: 2 m[IU]/min via INTRAVENOUS
  Filled 2016-05-03: qty 1000

## 2016-05-03 MED ORDER — PHENYLEPHRINE 40 MCG/ML (10ML) SYRINGE FOR IV PUSH (FOR BLOOD PRESSURE SUPPORT)
80.0000 ug | PREFILLED_SYRINGE | INTRAVENOUS | Status: DC | PRN
  Filled 2016-05-03: qty 5

## 2016-05-03 MED ORDER — TERBUTALINE SULFATE 1 MG/ML IJ SOLN
0.2500 mg | Freq: Once | INTRAMUSCULAR | Status: DC | PRN
  Filled 2016-05-03: qty 1

## 2016-05-03 NOTE — H&P (Signed)
LABOR AND DELIVERY ADMISSION HISTORY AND PHYSICAL NOTE  Mindy Russo is a 26 y.o. female G2P1001 with IUP at [redacted]w[redacted]d by LMP consistent with 17 wk Ultrasound presenting for elevated BP's in the office she had a 150/90. She had some interment headache over the past few weeks and increased swelling. She had 2+ protein in the office.   She reports positive fetal movement. She denies leakage of fluid or vaginal bleeding.  Prenatal History/Complications:  Past Medical History: Past Medical History:  Diagnosis Date  . Endometriosis     Past Surgical History: Past Surgical History:  Procedure Laterality Date  . LAPAROSCOPY      Obstetrical History: OB History    Gravida Para Term Preterm AB Living   SAB TAB Ectopic Multiple Live Births           1      Social History: Social History   Social History  . Marital status: Single    Spouse name: N/A  . Number of children: N/A  . Years of education: N/A   Social History Main Topics  . Smoking status: Never Smoker  . Smokeless tobacco: Never Used  . Alcohol use No  . Drug use: No  . Sexual activity: Yes   Other Topics Concern  . Not on file   Social History Narrative  . No narrative on file    Family History: Family History  Problem Relation Age of Onset  . Diabetes Mother     Allergies: No Known Allergies  Prescriptions Prior to Admission  Medication Sig Dispense Refill Last Dose  . azithromycin (ZITHROMAX) 250 MG tablet Take 250 mg by mouth daily.   05/02/2016  . loratadine (CLARITIN) 10 MG tablet Take 10 mg by mouth daily.   05/02/2016  . Prenatal Multivit-Min-Fe-FA (PRENATAL VITAMINS) 0.8 MG tablet Take 1 tablet by mouth daily. 30 tablet 12 05/02/2016     Review of Systems   All systems reviewed and negative except as stated in HPI  Blood pressure 132/68, pulse 91, temperature 98.2 F (36.8 C), temperature source Oral, resp. rate 16, height  (1.727 m), weight 211 lb (95.7 kg), last  menstrual period 08/04/2015. General appearance: alert, cooperative and appears stated age Lungs: clear to auscultation bilaterally Heart: regular rate and rhythm Abdomen: soft, non-tender; bowel sounds normal Extremities: No calf swelling or tenderness Presentation: cephalic by nursing exam Fetal monitoring: category 1 Uterine activity: no contractions Dilation: 3 Effacement (%): 50 Station: -2 Exam by:: Mindy Russo   Prenatal labs: ABO, Rh: B/POS/-- (11/27 1331) Antibody: NEG (11/27 1331) Rubella: immune RPR: NON REAC (01/22 0001)  HBsAg: NEGATIVE (11/27 1331)  HIV: NONREACTIVE (01/22 0001)  GBS: Positive (03/22 0000)  2 hr Glucola: normal Genetic screening:  declined Anatomy US: negative  Prenatal Transfer Tool  Maternal Diabetes: No Genetic Screening: Declined Maternal Ultrasounds/Referrals: Normal Fetal Ultrasounds or other Referrals:  None Maternal Substance Abuse:  No Significant Maternal Medications:  None Significant Maternal Lab Results: Lab values include: Group B Strep positive  Results for orders placed or performed during the hospital encounter of 05/03/16 (from the past 24 hour(s))  CBC   Collection Time: 05/03/16  5:30 PM  Result Value Ref Range   WBC 12.9 (H) 4.0 - 10.5 K/uL   RBC 3.75 (L) 3.87 - 5.11 MIL/uL   Hemoglobin 11.4 (L) 12.0 - 15.0 g/dL   HCT 40.9 (L) 81.1 - 91.4 %   MCV 92.3  78.0 - 100.0 fL   MCH 30.4 26.0 - 34.0 pg   MCHC 32.9 30.0 - 36.0 g/dL   16.1DW 14.2 09.6 - 04.5 %   Platelets 204 150 - 400 K/uL  Results for orders placed or performed in visit on 05/03/16 (from the past 24 hour(s))  POCT urinalysis dip (device)   Collection Time: 05/03/16  4:14 PM  Result Value Ref Range   Glucose, UA 100 (A) NEGATIVE mg/dL   Bilirubin Urine NEGATIVE NEGATIVE   Ketones, ur TRACE (A) NEGATIVE mg/dL   Specific Gravity, Urine 1.025 1.005 - 1.030   Hgb urine dipstick NEGATIVE NEGATIVE   pH 7.0 5.0 - 8.0   Protein, ur 100 (A) NEGATIVE mg/dL    Urobilinogen, UA 2.0 (H) 0.0 - 1.0 mg/dL   Nitrite NEGATIVE NEGATIVE   Leukocytes, UA SMALL (A) NEGATIVE    Patient Active Problem List   Diagnosis Date Noted  . Gestational hypertension 05/03/2016  . Rubella non-immune status, antepartum 12/24/2015  . Supervision of normal pregnancy 12/22/2015  . GBS (group B Streptococcus carrier), +RV culture, currently pregnant 12/22/2015    Assessment: Mindy Russo is a 26 y.o. G2P1001 at [redacted]w[redacted]d here for gHTN  #Labor:induce with pitocin, pt is a multiple with a 3cm cervix #Pain: Plans for epidural #FWB: Category 1 #ID:  GBS postiive PCN #MOF: bottle #MOC:undecided #Circ:  n/a #Elevated BP in pregnancy: has not had 2 elevated BP 4 hours apart. [place] monitor. Labs checked.   Mindy Russo 05/03/2016, 6:31 PM

## 2016-05-03 NOTE — Anesthesia Preprocedure Evaluation (Signed)
Anesthesia Evaluation  Patient identified by MRN, date of birth, ID band Patient awake    Reviewed: Allergy & Precautions, NPO status , Patient's Chart, lab work & pertinent test results  History of Anesthesia Complications Negative for: history of anesthetic complications  Airway Mallampati: I  TM Distance: >3 FB Neck ROM: Full    Dental  (+) Dental Advisory Given   Pulmonary Recent URI  (residual stuffy nose),    breath sounds clear to auscultation       Cardiovascular hypertension (gestational HTN),  Rhythm:Regular Rate:Normal     Neuro/Psych negative neurological ROS     GI/Hepatic negative GI ROS, Neg liver ROS,   Endo/Other  negative endocrine ROS  Renal/GU negative Renal ROS     Musculoskeletal   Abdominal   Peds  Hematology plt 204K   Anesthesia Other Findings   Reproductive/Obstetrics (+) Pregnancy                             Anesthesia Physical Anesthesia Plan  ASA: II  Anesthesia Plan: Epidural   Post-op Pain Management:    Induction:   Airway Management Planned: Natural Airway  Additional Equipment:   Intra-op Plan:   Post-operative Plan:   Informed Consent: I have reviewed the patients History and Physical, chart, labs and discussed the procedure including the risks, benefits and alternatives for the proposed anesthesia with the patient or authorized representative who has indicated his/her understanding and acceptance.   Dental advisory given  Plan Discussed with:   Anesthesia Plan Comments: (Patient identified. Risks/Benefits/Options discussed with patient including but not limited to bleeding, infection, nerve damage, paralysis, failed block, incomplete pain control, headache, blood pressure changes, nausea, vomiting, reactions to medication both or allergic, itching and postpartum back pain. Confirmed with bedside nurse the patient's most recent platelet  count. Confirmed with patient that they are not currently taking any anticoagulation, have any bleeding history or any family history of bleeding disorders. Patient expressed understanding and wished to proceed. All questions were answered. )        Anesthesia Quick Evaluation

## 2016-05-03 NOTE — Progress Notes (Signed)
   PRENATAL VISIT NOTE  Subjective:  Mindy Russo is a 26 y.o. G2P1001 at [redacted]w[redacted]d being seen today for ongoing prenatal care.  She is currently monitored for the following issues for this low-risk pregnancy and has Supervision of normal pregnancy; GBS (group B Streptococcus carrier), +RV culture, currently pregnant; and Rubella non-immune status, antepartum on her problem list.  Patient reports occasional contractions.  Contractions: Irregular. Vag. Bleeding: None.  Movement: Present. Denies leaking of fluid.   The following portions of the patient's history were reviewed and updated as appropriate: allergies, current medications, past family history, past medical history, past social history, past surgical history and problem list. Problem list updated.  Objective:   Vitals:   05/03/16 1608 05/03/16 1612  BP: 140/81 140/73  Pulse: (!) 107 (!) 105  Weight: 211 lb (95.7 kg)     Fetal Status:     Movement: Present  Presentation: Vertex  General:  Alert, oriented and cooperative. Patient is in no acute distress.  Skin: Skin is warm and dry. No rash noted.   Cardiovascular: Normal heart rate noted  Respiratory: Normal respiratory effort, no problems with respiration noted  Abdomen: Soft, gravid, appropriate for gestational age. Pain/Pressure: Present     Pelvic:  Cervical exam performed Dilation: 3 Effacement (%): 50 Station: -2  Extremities: Normal range of motion.  Edema: Mild pitting, slight indentation  Mental Status: Normal mood and affect. Normal behavior. Normal judgment and thought content.   Assessment and Plan:  Pregnancy: G2P1001 at [redacted]w[redacted]d 1. Encounter for supervision of other normal pregnancy in third trimester  2. Pregnancy-induced hypertension in third trimester --Gestational hypertension with new onset 2+ proteinuria, intermittent mild h/a at [redacted] weeks gestation.  Favorable cervix. --Direct admission to [facility] for IOL  3. GBS (group B Streptococcus carrier),  +RV culture, currently pregnant  Pt transported to [facility] for IOL.  Pt stable at time of transfer.  Hurshel Party, CNM

## 2016-05-03 NOTE — Progress Notes (Signed)
Labor Progress Note Mindy Russo is a 26 y.o. G2P1001 at [redacted]w[redacted]d presented for labor  S:  Feeling some ctx, had Fentanyl recently with good relief.  O:  BP 114/73 (BP Location: Right Arm)   Pulse 83   Temp 98.2 F (36.8 C) (Oral)   Resp 18   Ht  (1.727 m)   Wt 95.7 kg (211 lb)   LMP 08/04/2015 (LMP Unknown)   BMI 32.08 kg/m  EFM: baseline 145 bpm/ mod variability/ + accels/ no decels  Toco: 3-4 SVE: Dilation: 3 Effacement (%): 50 Cervical Position: Posterior Station: -2 Presentation: Vertex Exam by:: Doloris Hall RN Pitocin: 6 mu/min  A/P: 26 y.o. G2P1001 [redacted]w[redacted]d  1. Labor: latent 2. FWB: Cat I 3. Pain: Fentanyl Continue Pitocin titration. Analgesia/anesthesia prn. Anticipate labor progression and SVD.  Donette Larry, CNM 8:53 PM

## 2016-05-03 NOTE — Anesthesia Procedure Notes (Signed)
Epidural Patient location during procedure: OB Start time: 05/03/2016 11:04 PM End time: 05/03/2016 11:27 PM  Staffing Anesthesiologist: Jairo Ben Performed: anesthesiologist   Preanesthetic Checklist Completed: patient identified, surgical consent, pre-op evaluation, timeout performed, IV checked, risks and benefits discussed and monitors and equipment checked  Epidural Patient position: sitting Prep: site prepped and draped and DuraPrep Patient monitoring: blood pressure, continuous pulse ox and heart rate Approach: midline Location: L3-L4 Injection technique: LOR air  Needle:  Needle type: Tuohy  Needle gauge: 17 G Needle length: 9 cm Needle insertion depth: 5 cm Catheter type: closed end flexible Catheter size: 19 Gauge Catheter at skin depth: 11 cm Test dose: negative (1% lidociane)  Assessment Events: blood not aspirated, injection not painful, no injection resistance, negative IV test and no paresthesia  Additional Notes Pt identified in Labor room.  Monitors applied. Working IV access confirmed. Sterile prep, drape lumbar spine.  1% lido local L 3,4.  #17ga Touhy LOR air at 5 cm L 3,4, cath in easily to 11 cm skin. Test dose OK, cath dosed and infusion begun.  Patient asymptomatic, VSS, no heme aspirated, tolerated well.  Sandford Craze, MDReason for block:procedure for pain

## 2016-05-03 NOTE — Anesthesia Pain Management Evaluation Note (Signed)
  CRNA Pain Management Visit Note  Patient: Mindy Russo, 27 y.o., female  "Hello I am a member of the anesthesia team at [facility]. We have an anesthesia team available at all times to provide care throughout the hospital, including epidural management and anesthesia for C-section. I don't know your plan for the delivery whether it a natural birth, water birth, IV sedation, nitrous supplementation, doula or epidural, but we want to meet your pain goals."   1.Was your pain managed to your expectations on prior hospitalizations?   Yes   2.What is your expectation for pain management during this hospitalization?     Epidural  3.How can we help you reach that goal? Epidural requested now. Told RN patient would like epidural.  Record the patient's initial score and the patient's pain goal.   Pain: 6  Pain Goal: 5 [facility] wants you to be able to say your pain was always managed very well.  Mindy Russo 05/03/2016

## 2016-05-03 NOTE — Progress Notes (Addendum)
Labor Progress Note Mindy Russo is a 26 y.o. G2P1001 at [redacted]w[redacted]d presented for labor and elevated BP S: no complaint. Denies headache, vision change, shortness of breath.  O:  BP (!) 101/57   Pulse 83   Temp 97.6 F (36.4 C) (Oral)   Resp 18   Ht  (1.727 m)   Wt 211 lb (95.7 kg)   LMP 08/04/2015 (LMP Unknown)   BMI 32.08 kg/m  EFM: 145/mod var/no decels  CVE: Dilation: 3 Effacement (%): 50 Cervical Position: Posterior Station: -2 Presentation: Vertex Exam by:: Doloris Hall RN   [name]: 26 y.o. G2P1001 [redacted]w[redacted]d admitted for labor pain and elevated BP. BP within normal now.  #Labor: continue pit #Pain: epidural up on request #FWB: CAT-1 #GBS: pos. PCN  Almon Hercules, MD 10:21 PM

## 2016-05-04 ENCOUNTER — Encounter (HOSPITAL_COMMUNITY): Payer: Self-pay | Admitting: *Deleted

## 2016-05-04 DIAGNOSIS — O1092 Unspecified pre-existing hypertension complicating childbirth: Secondary | ICD-10-CM

## 2016-05-04 DIAGNOSIS — O99824 Streptococcus B carrier state complicating childbirth: Secondary | ICD-10-CM

## 2016-05-04 DIAGNOSIS — Z3A39 39 weeks gestation of pregnancy: Secondary | ICD-10-CM

## 2016-05-04 LAB — CBC
HCT: 30.8 % — ABNORMAL LOW (ref 36.0–46.0)
HEMOGLOBIN: 10.3 g/dL — AB (ref 12.0–15.0)
MCH: 30.9 pg (ref 26.0–34.0)
MCHC: 33.4 g/dL (ref 30.0–36.0)
MCV: 92.5 fL (ref 78.0–100.0)
Platelets: 182 10*3/uL (ref 150–400)
RBC: 3.33 MIL/uL — ABNORMAL LOW (ref 3.87–5.11)
RDW: 14.2 % (ref 11.5–15.5)
WBC: 16.7 10*3/uL — ABNORMAL HIGH (ref 4.0–10.5)

## 2016-05-04 LAB — ABO/RH: ABO/RH(D): B POS

## 2016-05-04 LAB — RPR: RPR: NONREACTIVE

## 2016-05-04 MED ORDER — WITCH HAZEL-GLYCERIN EX PADS
1.0000 | MEDICATED_PAD | CUTANEOUS | Status: DC | PRN
Start: 2016-05-04 — End: 2016-05-06

## 2016-05-04 MED ORDER — COCONUT OIL OIL: 1.0000 "application " | TOPICAL_OIL | Status: DC | PRN

## 2016-05-04 MED ORDER — ONDANSETRON HCL 4 MG/2ML IJ SOLN: 4.0000 mg | INTRAMUSCULAR | Status: DC | PRN

## 2016-05-04 MED ORDER — ONDANSETRON HCL 4 MG PO TABS: 4.0000 mg | ORAL_TABLET | ORAL | Status: DC | PRN

## 2016-05-04 MED ORDER — IBUPROFEN 600 MG PO TABS
600.0000 mg | ORAL_TABLET | Freq: Four times a day (QID) | ORAL | Status: DC
  Administered 2016-05-04 – 2016-05-06 (×10): 600 mg via ORAL
  Filled 2016-05-04 (×8): qty 1

## 2016-05-04 MED ORDER — LORATADINE 10 MG PO TABS
10.0000 mg | ORAL_TABLET | Freq: Every day | ORAL | Status: DC
  Administered 2016-05-04 – 2016-05-06 (×3): 10 mg via ORAL
  Filled 2016-05-04 (×2): qty 1

## 2016-05-04 MED ORDER — ACETAMINOPHEN 325 MG PO TABS: 650.0000 mg | ORAL_TABLET | ORAL | Status: DC | PRN

## 2016-05-04 MED ORDER — DIPHENHYDRAMINE HCL 25 MG PO CAPS: 25.0000 mg | ORAL_CAPSULE | Freq: Four times a day (QID) | ORAL | Status: DC | PRN

## 2016-05-04 MED ORDER — AZITHROMYCIN 250 MG PO TABS
250.0000 mg | ORAL_TABLET | Freq: Every day | ORAL | Status: AC
  Administered 2016-05-04 – 2016-05-05 (×2): 250 mg via ORAL
  Filled 2016-05-04 (×2): qty 1

## 2016-05-04 MED ORDER — SIMETHICONE 80 MG PO CHEW: 80.0000 mg | CHEWABLE_TABLET | ORAL | Status: DC | PRN

## 2016-05-04 MED ORDER — DIBUCAINE 1 % RE OINT: 1.0000 "application " | TOPICAL_OINTMENT | RECTAL | Status: DC | PRN

## 2016-05-04 MED ORDER — TETANUS-DIPHTH-ACELL PERTUSSIS 5-2.5-18.5 LF-MCG/0.5 IM SUSP: 0.5000 mL | Freq: Once | INTRAMUSCULAR | Status: DC

## 2016-05-04 MED ORDER — SENNOSIDES-DOCUSATE SODIUM 8.6-50 MG PO TABS
2.0000 | ORAL_TABLET | ORAL | Status: DC
  Administered 2016-05-04 – 2016-05-05 (×2): 2 via ORAL
  Filled 2016-05-04 (×2): qty 2

## 2016-05-04 MED ORDER — BENZOCAINE-MENTHOL 20-0.5 % EX AERO: 1.0000 "application " | INHALATION_SPRAY | CUTANEOUS | Status: DC | PRN

## 2016-05-04 MED ORDER — ZOLPIDEM TARTRATE 5 MG PO TABS
5.0000 mg | ORAL_TABLET | Freq: Every evening | ORAL | Status: DC | PRN
Start: 2016-05-04 — End: 2016-05-06

## 2016-05-04 MED ORDER — PRENATAL MULTIVITAMIN CH
1.0000 | ORAL_TABLET | Freq: Every day | ORAL | Status: DC
  Administered 2016-05-04 – 2016-05-06 (×3): 1 via ORAL
  Filled 2016-05-04: qty 1

## 2016-05-04 NOTE — Progress Notes (Signed)
Labor Progress Note [name] is a 26 y.o. G2P1001 at [redacted]w[redacted]d presented for labor and elevated BP S: reports pressure in her pelvis.   O:  BP 119/85   Pulse 83   Temp 97.8 F (36.6 C) (Oral)   Resp 20   Ht  (1.727 m)   Wt 211 lb (95.7 kg)   LMP 08/04/2015 (LMP Unknown)   SpO2 100%   BMI 32.08 kg/m  EFM: 150/mod var/occ variable decels  CVE: Dilation: 10 Effacement (%): 100% Cervical Position: anterior Station: 0 Presentation: Vertex Exam by:: Sunday Spillers, RN   [name]: 26 y.o. G2P1001 [redacted]w[redacted]d admitted for labor pain and elevated BP. BP within normal range. .  #Labor: continue pit #Pain: epidural #FWB: CAT-1 #GBS: pos. PCN  Almon Hercules, MD 4:13 AM

## 2016-05-04 NOTE — Anesthesia Postprocedure Evaluation (Addendum)
Anesthesia Post Note  Patient: Mindy Russo  Procedure(s) Performed: * No procedures listed *  Patient location during evaluation: Mother Baby Anesthesia Type: Epidural Level of consciousness: awake and alert and oriented Pain management: pain level controlled Vital Signs Assessment: post-procedure vital signs reviewed and stable Respiratory status: spontaneous breathing and nonlabored ventilation Cardiovascular status: stable Postop Assessment: no headache, epidural receding, patient able to bend at knees, adequate PO intake, no backache and no signs of nausea or vomiting Anesthetic complications: no        Last Vitals:  Vitals:   05/04/16 0636 05/04/16 0730  BP: 116/68 (!) 113/51  Pulse: 75 79  Resp: 18 18  Temp: 36.5 C 36.4 C    Last Pain:  Vitals:   05/04/16 0730  TempSrc: Oral  PainSc:    Pain Goal:                 [employer]

## 2016-05-04 NOTE — Lactation Note (Signed)
This note was copied from a baby's chart. Lactation Consultation Note  Patient Name: Mindy Russo Date: 05/04/2016 Reason for consult: Initial assessment  Baby 6 hours old. Mom thought that she wanted to feed BR/FO, but has given a bottle of formula and no longer interested in BF.   Maternal Data    Feeding Feeding Type: Bottle Fed - Formula Nipple Type: Slow - flow  LATCH Score/Interventions                      Lactation Tools Discussed/Used     Consult Status Consult Status: Complete    Sherlyn Hay 05/04/2016, 10:17 AM

## 2016-05-05 NOTE — Progress Notes (Signed)
POSTPARTUM PROGRESS NOTE  Post Partum Day #1  Subjective:  Mindy Russo is a 26 y.o. [id number] [redacted]w[redacted]d s/p SVD.  No acute events overnight.  Pt denies problems with ambulating, voiding or po intake.  She denies nausea or vomiting.  Pain is well controlled.  She has had flatus. She has had bowel movement.  Lochia Minimal.   Objective: Blood pressure 125/66, pulse 72, temperature 97.8 F (36.6 C), temperature source Oral, resp. rate 18, height  (1.727 m), weight 211 lb (95.7 kg), last menstrual period 08/04/2015, SpO2 100 %, unknown if currently breastfeeding.  Physical Exam:  General: alert, cooperative and no distress Lochia:normal flow Chest: no respiratory distress Heart:regular rate, distal pulses intact Abdomen: soft, mildly tender on palpation,  Uterine Fundus: firm, appropriately tender DVT Evaluation: No calf swelling or tenderness Extremities: mild edema   Recent Labs  05/03/16 1730 05/04/16 0723  HGB 11.4* 10.3*  HCT 34.6* 30.8*    Assessment/Plan:  ASSESSMENT: Mindy Russo is a 26 y.o. [id number] [redacted]w[redacted]d s/p SVD after IOL in the setting of gHTN. BP measurements have been within normal limits. Patient denies any headaches, vision changes, dizziness, abdominal pain. Patient is well appearing with no concerns presently.  Plan for discharge tomorrow and Contraception Nexplanon. Patient will be bottle feeding.   LOS: 2 days   Zaynab Chipman DialloMD 05/05/2016, 7:33 AM

## 2016-05-06 MED ORDER — IBUPROFEN 600 MG PO TABS: 600.0000 mg | ORAL_TABLET | Freq: Four times a day (QID) | ORAL | 0 refills | Status: AC

## 2016-05-06 MED ORDER — MEDROXYPROGESTERONE ACETATE 150 MG/ML IM SUSP
150.0000 mg | Freq: Once | INTRAMUSCULAR | Status: AC
  Administered 2016-05-06: 150 mg via INTRAMUSCULAR
  Filled 2016-05-06: qty 1

## 2016-05-06 NOTE — Discharge Instructions (Signed)

## 2016-05-06 NOTE — Discharge Summary (Signed)
OB Discharge Summary  Patient Name: Mindy Russo DOB: 1990/10/02 MRN: [id number]  Date of admission: 05/03/2016 Delivering MD: Candelaria Stagers T   Date of discharge: 05/06/2016  Admitting diagnosis: INDUCTION Intrauterine pregnancy: [redacted]w[redacted]d     Secondary diagnosis:Active Problems:   Gestational hypertension  Additional problems anemia     Discharge diagnosis: Term Pregnancy Delivered and CHTN                                                                      Augmentation: Pitocin  Complications: None  Hospital course:  Induction of Labor With Vaginal Delivery   26 y.o. yo [id number] at [redacted]w[redacted]d was admitted to the hospital 05/03/2016 for induction of labor.  Indication for induction: Gestational hypertension.  Patient had an uncomplicated labor course as follows: Membrane Rupture Time/Date: 4:07 AM ,05/04/2016   Intrapartum Procedures: Episiotomy: None [1]                                         Lacerations:  None [1]  Patient had delivery of a Viable infant.  Information for the patient's newborn:  Zoeya, Gramajo [[id number]]  Delivery Method: Vaginal, Spontaneous Delivery (Filed from Delivery Summary)   05/04/2016  Details of delivery can be found in separate delivery note.  Patient had a routine postpartum course. Patient is discharged home 05/06/16.  Physical exam  Vitals:   05/04/16 1834 05/05/16 0700 05/05/16 1900 05/06/16 0551  BP: 119/73 125/66 126/70 116/67  Pulse: 80 72 88 72  Resp: Temp: 97.3 F (36.3 C) 97.8 F (36.6 C) 98.1 F (36.7 C) 97.7 F (36.5 C)  TempSrc: Oral Oral Oral Oral  SpO2:      Weight:      Height:       General: alert Lochia: appropriate Uterine Fundus: firm and NT at U-1 DVT Evaluation: No evidence of DVT seen on physical exam. Labs: Lab Results  Component Value Date   WBC 16.7 (H) 05/04/2016   HGB 10.3 (L) 05/04/2016   HCT 30.8 (L) 05/04/2016   MCV 92.5 05/04/2016   PLT 182 05/04/2016   CMP Latest Ref Rng &  Units 05/03/2016  Glucose 65 - 99 mg/dL 130(Q)  BUN 6 - 20 mg/dL 9  Creatinine 6.57 - 8.46 mg/dL 9.62  Sodium 952 - 841 mmol/L 134(L)  Potassium 3.5 - 5.1 mmol/L 4.1  Chloride 101 - 111 mmol/L 108  CO2 22 - 32 mmol/L 19(L)  Calcium 8.9 - 10.3 mg/dL 3.2(G)  Total Protein 6.5 - 8.1 g/dL 6.2(L)  Total Bilirubin 0.3 - 1.2 mg/dL 0.7  Alkaline Phos 38 - 126 U/L 154(H)  AST 15 - 41 U/L 18  ALT 14 - 54 U/L 16    Discharge instruction: per After Visit Summary and "Baby and Me Booklet".  After Visit Meds:  Allergies as of 05/06/2016   No Known Allergies     Medication List    STOP taking these medications   azithromycin 250 MG tablet Commonly known as:  ZITHROMAX     TAKE these medications   ibuprofen 600 MG tablet Commonly known as:  ADVIL,MOTRIN Take 1 tablet (600 mg total) by mouth every 6 (six) hours.   loratadine 10 MG tablet Commonly known as:  CLARITIN Take 10 mg by mouth daily.   Prenatal Vitamins 0.8 MG tablet Take 1 tablet by mouth daily.       Diet: routine diet  Activity: Advance as tolerated. Pelvic rest for 6 weeks.   Outpatient follow up:6 weeks Follow up Appt:Future Appointments Date Time Provider Department Center  06/15/2016 10:00 AM Marny Lowenstein, PA-C [facility]   Follow up visit: No Follow-up on file.  Postpartum contraception: Depo Provera prior to discharge home and Nexplanon at postpartum visit  Newborn Data: Live born female  Birth Weight: 6 lb 5.8 oz (2885 g) APGAR: 8, 9  Baby Feeding: Bottle Disposition:home with mother pending bilirubin level and peds approval   05/06/2016 Allie Bossier, MD

## 2016-05-12 ENCOUNTER — Encounter: Payer: BLUE CROSS/BLUE SHIELD | Admitting: Family Medicine

## 2016-06-15 ENCOUNTER — Ambulatory Visit (INDEPENDENT_AMBULATORY_CARE_PROVIDER_SITE_OTHER): Payer: BLUE CROSS/BLUE SHIELD | Admitting: Medical

## 2016-06-15 ENCOUNTER — Encounter: Payer: Self-pay | Admitting: Medical

## 2016-06-15 NOTE — Progress Notes (Signed)
Subjective:     Mindy Russo is a 26 y.o. female who presents for a postpartum visit. She is six weeks  postpartum following a vaginal delivery. I have fully reviewed the prenatal and intrapartum course. The delivery was at 39 gestational weeks. Outcome: vaginal. Anesthesia: epidual. Postpartum course has been unomplicated. Baby's course has been uncomplicated. Baby is feeding by breast/bottle. Bleeding small bleeding. Bowel function is normal . Bladder function is normal. Patient is sexually active. Contraception method is depo-provera. Postpartum depression screening: negative   The following portions of the patient's history were reviewed and updated as appropriate: allergies, current medications, past family history, past medical history, past social history, past surgical history and problem list.  Review of Systems Pertinent items are noted in HPI.   Objective:    BP 119/72   Pulse 73   Wt 188 lb 9.6 oz (85.5 kg)   BMI 28.68 kg/m   General:  alert and cooperative   Breasts:  not performed  Lungs: clear to auscultation bilaterally  Heart:  regular rate and rhythm, S1, S2 normal, no murmur, click, rub or gallop  Abdomen: soft, non-tender; bowel sounds normal; no masses,  no organomegaly   Vulva:  not evaluated  Vagina: not evaluated  Cervix:  not evaluated  Corpus: not examined  Adnexa:  not evaluated  Rectal Exam: Not performed.        Assessment:     Normal postpartum exam. Pap smear not done at today's visit.  Last pap smear 2016 was normal.   Plan:    1. Contraception: Depo-Provera injections 2. Follow up in: 1 year for annual exam and in early July for depo provera or sooner as needed.    Marny LowensteinWenzel, Trinika Cortese N, PA-C 06/15/2016 10:46 AM

## 2016-06-15 NOTE — Patient Instructions (Signed)

## 2016-07-30 ENCOUNTER — Ambulatory Visit: Payer: BLUE CROSS/BLUE SHIELD

## 2016-08-05 ENCOUNTER — Ambulatory Visit (INDEPENDENT_AMBULATORY_CARE_PROVIDER_SITE_OTHER): Payer: BLUE CROSS/BLUE SHIELD

## 2016-08-05 VITALS — BP 140/72 | HR 76

## 2016-08-05 DIAGNOSIS — Z3042 Encounter for surveillance of injectable contraceptive: Secondary | ICD-10-CM

## 2016-08-05 MED ORDER — MEDROXYPROGESTERONE ACETATE 150 MG/ML IM SUSP
150.0000 mg | Freq: Once | INTRAMUSCULAR | Status: AC
  Administered 2016-08-05: 150 mg via INTRAMUSCULAR

## 2016-08-05 NOTE — Addendum Note (Signed)
Addended by: Cheree DittoGRAHAM, Taryn Shellhammer A on: 08/05/2016 12:11 PM   Modules accepted: Orders

## 2016-08-05 NOTE — Progress Notes (Signed)
Patient presented to the office today for her depo-provera. Patient tolerated well and will follow up in three months.

## 2016-08-12 NOTE — Addendum Note (Signed)
Addendum  created 08/12/16 1732 by Jairo BenJackson, Harbor Vanover, MD   Sign clinical note

## 2016-10-21 ENCOUNTER — Ambulatory Visit (INDEPENDENT_AMBULATORY_CARE_PROVIDER_SITE_OTHER): Payer: BLUE CROSS/BLUE SHIELD | Admitting: *Deleted

## 2016-10-21 VITALS — BP 115/67 | HR 88

## 2016-10-21 DIAGNOSIS — Z3042 Encounter for surveillance of injectable contraceptive: Secondary | ICD-10-CM | POA: Diagnosis not present

## 2016-10-21 MED ORDER — MEDROXYPROGESTERONE ACETATE 150 MG/ML IM SUSP
150.0000 mg | Freq: Once | INTRAMUSCULAR | Status: AC
  Administered 2016-10-21: 150 mg via INTRAMUSCULAR

## 2017-01-06 ENCOUNTER — Ambulatory Visit: Payer: BLUE CROSS/BLUE SHIELD

## 2017-01-11 ENCOUNTER — Ambulatory Visit: Payer: Medicaid Other

## 2017-01-11 VITALS — BP 139/77 | HR 75

## 2017-01-11 DIAGNOSIS — Z3042 Encounter for surveillance of injectable contraceptive: Secondary | ICD-10-CM | POA: Diagnosis not present

## 2017-01-11 MED ORDER — MEDROXYPROGESTERONE ACETATE 150 MG/ML IM SUSP
150.0000 mg | Freq: Once | INTRAMUSCULAR | Status: AC
  Administered 2017-01-11: 150 mg via INTRAMUSCULAR

## 2017-01-11 NOTE — Progress Notes (Signed)
Pt is in office for depo injection.  Pt is on time for her Depo. Depo given today from clinic stock. Pt tolerated injection well. Pt advised to RTO 3/5-19 for next depo.  Pt has no other concerns today.   Administrations This Visit    medroxyPROGESTERone (DEPO-PROVERA) injection 150 mg    Admin Date 01/11/2017 Action Given Dose 150 mg Route Intramuscular Administered By Lanney GinsFoster, Akshath Mccarey D, CMA

## 2017-03-29 ENCOUNTER — Ambulatory Visit: Payer: Medicaid Other

## 2017-03-30 ENCOUNTER — Ambulatory Visit (INDEPENDENT_AMBULATORY_CARE_PROVIDER_SITE_OTHER): Payer: Medicaid Other | Admitting: *Deleted

## 2017-03-30 VITALS — BP 121/66 | HR 69

## 2017-03-30 DIAGNOSIS — Z3042 Encounter for surveillance of injectable contraceptive: Secondary | ICD-10-CM

## 2017-03-30 MED ORDER — MEDROXYPROGESTERONE ACETATE 150 MG/ML IM SUSP
150.0000 mg | Freq: Once | INTRAMUSCULAR | Status: AC
  Administered 2017-03-30: 150 mg via INTRAMUSCULAR

## 2017-03-30 NOTE — Progress Notes (Signed)
I have reviewed this chart and agree with the RN/CMA assessment and management.    K. Meryl Davis, M.D. Attending Obstetrician & Gynecologist, [facility], [facility]  

## 2017-06-17 ENCOUNTER — Ambulatory Visit: Payer: 59

## 2017-07-04 ENCOUNTER — Other Ambulatory Visit (HOSPITAL_COMMUNITY)
Admission: RE | Admit: 2017-07-04 | Discharge: 2017-07-04 | Disposition: A | Discharge status: Home / Self Care | Payer: 59 | Source: Ambulatory Visit | Attending: Obstetrics & Gynecology | Admitting: Obstetrics & Gynecology

## 2017-07-04 ENCOUNTER — Ambulatory Visit (INDEPENDENT_AMBULATORY_CARE_PROVIDER_SITE_OTHER): Payer: 59

## 2017-07-04 VITALS — BP 118/68 | HR 82 | Ht 67.0 in | Wt 192.1 lb

## 2017-07-04 DIAGNOSIS — Z01419 Encounter for gynecological examination (general) (routine) without abnormal findings: Secondary | ICD-10-CM | POA: Insufficient documentation

## 2017-07-04 MED ORDER — MEDROXYPROGESTERONE ACETATE 150 MG/ML IM SUSP
150.0000 mg | Freq: Once | INTRAMUSCULAR | Status: AC
  Administered 2017-07-04: 150 mg via INTRAMUSCULAR

## 2017-07-04 NOTE — Progress Notes (Signed)
GYNECOLOGY ANNUAL PREVENTATIVE CARE ENCOUNTER NOTE  Subjective:   Mindy Russo is a 27 y.o. [phone] female here for a routine annual gynecologic exam.  Current complaints: none.   Denies abnormal vaginal bleeding, discharge, pelvic pain, problems with intercourse or other gynecologic concerns.    Gynecologic History No LMP recorded. Patient has had an injection. Contraception: Depo-Provera injections Last Pap: 2-3 years ago. Patient states results were: normal with negative HPV  Obstetric History OB History  Gravida Para Term Preterm AB Living  3 2 2   1 2   SAB TAB Ectopic Multiple Live Births  1     0 2    # Outcome Date GA Lbr Len/2nd Weight Sex Delivery Anes PTL Lv  3 Term 05/04/16 3844w1d 04:05 / 00:11 6 lb 5.8 oz (2.885 kg) F Vag-Spont EPI  LIV  2 Term 12/17/14 6413w0d  6 lb 15 oz (3.147 kg) F Vag-Spont  N LIV  1 SAB             Past Medical History:  Diagnosis Date  . Endometriosis     Past Surgical History:  Procedure Laterality Date  . LAPAROSCOPY      Current Outpatient Medications on File Prior to Visit  Medication Sig Dispense Refill  . ibuprofen (ADVIL,MOTRIN) 600 MG tablet Take 1 tablet (600 mg total) by mouth every 6 (six) hours. (Patient not taking: Reported on 07/04/2017) 30 tablet 0  . loratadine (CLARITIN) 10 MG tablet Take 10 mg by mouth daily.     No current facility-administered medications on file prior to visit.     No Known Allergies  Social History   Socioeconomic History  . Marital status: Single    Spouse name: Not on file  . Number of children: Not on file  . Years of education: Not on file  . Highest education level: Not on file  Occupational History  . Not on file  Social Needs  . Financial resource strain: Not on file  . Food insecurity:    Worry: Not on file    Inability: Not on file  . Transportation needs:    Medical: Not on file    Non-medical: Not on file  Tobacco Use  . Smoking status: Never Smoker  . Smokeless  tobacco: Never Used  Substance and Sexual Activity  . Alcohol use: No    Comment: socially, random  . Drug use: No  . Sexual activity: Not Currently    Birth control/protection: Injection  Lifestyle  . Physical activity:    Days per week: Not on file    Minutes per session: Not on file  . Stress: Not on file  Relationships  . Social connections:    Talks on phone: Not on file    Gets together: Not on file    Attends religious service: Not on file    Active member of club or organization: Not on file    Attends meetings of clubs or organizations: Not on file    Relationship status: Not on file  . Intimate partner violence:    Fear of current or ex partner: Not on file    Emotionally abused: Not on file    Physically abused: Not on file    Forced sexual activity: Not on file  Other Topics Concern  . Not on file  Social History Narrative  . Not on file    Family History  Problem Relation Age of Onset  . Diabetes Mother   . Hypertension  Father   . Asthma Brother     The following portions of the patient's history were reviewed and updated as appropriate: allergies, current medications, past family history, past medical history, past social history, past surgical history and problem list.  Review of Systems Pertinent items noted in HPI and remainder of comprehensive ROS otherwise negative.   Objective:  BP 118/68   Pulse 82   Ht 5\' 7"  (1.702 m)   Wt 192 lb 1.6 oz (87.1 kg)   BMI 30.09 kg/m  CONSTITUTIONAL: Well-developed, well-nourished female in no acute distress.  HENT:  Normocephalic, atraumatic, External right and left ear normal. Oropharynx is clear and moist EYES: Conjunctivae and EOM are normal. Pupils are equal, round, and reactive to light. No scleral icterus.  NECK: Normal range of motion, supple, no masses.  Normal thyroid.  SKIN: Skin is warm and dry. No rash noted. Not diaphoretic. No erythema. No pallor. MUSCULOSKELETAL: Normal range of motion. No  tenderness.  No cyanosis, clubbing, or edema.  2+ distal pulses. NEUROLOGIC: Alert and oriented to person, place, and time. Normal reflexes, muscle tone coordination. No cranial nerve deficit noted. PSYCHIATRIC: Normal mood and affect. Normal behavior. Normal judgment and thought content. CARDIOVASCULAR: Normal heart rate noted, regular rhythm RESPIRATORY: Clear to auscultation bilaterally. Effort and breath sounds normal, no problems with respiration noted. BREASTS: Symmetric in size. No masses, skin changes, nipple drainage, or lymphadenopathy. ABDOMEN: Soft, normal bowel sounds, no distention noted.  No tenderness, rebound or guarding.  PELVIC: Normal appearing external genitalia; normal appearing vaginal mucosa and cervix.  No abnormal discharge noted.  Pap smear obtained.  Normal uterine size, no other palpable masses, no uterine or adnexal tenderness.  Assessment and Plan:  1. Well woman exam with routine gynecological exam -Pap today -Depo today  Will follow up results of pap smear and manage accordingly. Routine preventative health maintenance measures emphasized. Please refer to After Visit Summary for other counseling recommendations.   Rolm Bookbinder, CNM 07/04/17 5:32 PM

## 2017-07-07 LAB — CYTOLOGY - PAP: Diagnosis: NEGATIVE

## 2017-09-19 ENCOUNTER — Ambulatory Visit: Payer: 59

## 2017-12-19 ENCOUNTER — Ambulatory Visit (INDEPENDENT_AMBULATORY_CARE_PROVIDER_SITE_OTHER): Payer: 59 | Admitting: *Deleted

## 2017-12-19 VITALS — BP 138/75 | HR 72

## 2017-12-19 DIAGNOSIS — Z3042 Encounter for surveillance of injectable contraceptive: Secondary | ICD-10-CM | POA: Diagnosis not present

## 2017-12-19 LAB — POCT PREGNANCY, URINE: Preg Test, Ur: NEGATIVE

## 2017-12-19 MED ORDER — MEDROXYPROGESTERONE ACETATE 150 MG/ML IM SUSP
150.0000 mg | Freq: Once | INTRAMUSCULAR | Status: AC
  Administered 2017-12-19: 150 mg via INTRAMUSCULAR

## 2017-12-19 NOTE — Progress Notes (Signed)
Chales SalmonFaith Lesser here for Depo-Provera  Injection.  Injection administered without complication. Patient will return in 3 months for next injection. Last injection was June. States has not intercourse since June. UPT negative , will restart depo-provera.  Joyce CopaLinda Z , RN 12/19/2017  10:13 AM

## 2018-03-06 ENCOUNTER — Ambulatory Visit: Payer: 59

## 2018-03-09 ENCOUNTER — Ambulatory Visit (INDEPENDENT_AMBULATORY_CARE_PROVIDER_SITE_OTHER): Payer: 59 | Admitting: *Deleted

## 2018-03-09 VITALS — BP 111/70 | HR 67 | Ht 67.0 in | Wt 176.5 lb

## 2018-03-09 DIAGNOSIS — Z3042 Encounter for surveillance of injectable contraceptive: Secondary | ICD-10-CM | POA: Diagnosis not present

## 2018-03-09 MED ORDER — MEDROXYPROGESTERONE ACETATE 150 MG/ML IM SUSP
150.0000 mg | Freq: Once | INTRAMUSCULAR | Status: AC
  Administered 2018-03-09: 150 mg via INTRAMUSCULAR

## 2018-03-09 NOTE — Progress Notes (Signed)
Depo Provera 150 mg IM administered as scheduled. Pt tolerated well. Next injection due 5/1-5/15. Next Annual Gyn exam due after 07/05/18.

## 2018-05-26 ENCOUNTER — Ambulatory Visit (INDEPENDENT_AMBULATORY_CARE_PROVIDER_SITE_OTHER): Payer: 59 | Admitting: Emergency Medicine

## 2018-05-26 ENCOUNTER — Other Ambulatory Visit: Payer: Self-pay

## 2018-05-26 VITALS — Wt 183.1 lb

## 2018-05-26 DIAGNOSIS — Z3042 Encounter for surveillance of injectable contraceptive: Secondary | ICD-10-CM | POA: Diagnosis not present

## 2018-05-26 MED ORDER — MEDROXYPROGESTERONE ACETATE 150 MG/ML IM SUSP
150.0000 mg | Freq: Once | INTRAMUSCULAR | Status: AC
  Administered 2018-05-26: 150 mg via INTRAMUSCULAR

## 2018-05-26 NOTE — Progress Notes (Signed)
Agree with A & P. 

## 2018-05-26 NOTE — Progress Notes (Signed)
Mindy Russo here for Depo-Provera  Injection.  Injection administered without complication. Patient will return in 3 months for next injection.  Nena Alexander, RN 05/26/2018  12:03 PM

## 2018-08-11 ENCOUNTER — Ambulatory Visit: Payer: 59
